# Patient Record
Sex: Female | Born: 1945 | Hispanic: Yes | Smoking: Never smoker
Health system: Southern US, Community
[De-identification: ages and names within clinical notes are randomized; demographics above are authoritative.]

## PROBLEM LIST (undated history)

## (undated) DIAGNOSIS — I10 Essential (primary) hypertension: Secondary | ICD-10-CM

## (undated) DIAGNOSIS — K219 Gastro-esophageal reflux disease without esophagitis: Secondary | ICD-10-CM

## (undated) DIAGNOSIS — E119 Type 2 diabetes mellitus without complications: Secondary | ICD-10-CM

## (undated) HISTORY — DX: Gastro-esophageal reflux disease without esophagitis: K21.9

## (undated) HISTORY — PX: CHOLECYSTECTOMY: SHX55

## (undated) HISTORY — PX: ABDOMINAL HYSTERECTOMY: SHX81

---

## 2013-10-18 ENCOUNTER — Emergency Department: Payer: Self-pay | Admitting: Emergency Medicine

## 2013-10-18 LAB — CBC
HGB: 13.8 g/dL (ref 12.0–16.0)
MCH: 29.5 pg (ref 26.0–34.0)
MCV: 86 fL (ref 80–100)
RBC: 4.69 10*6/uL (ref 3.80–5.20)
RDW: 13.2 % (ref 11.5–14.5)
WBC: 10.7 10*3/uL (ref 3.6–11.0)

## 2013-10-18 LAB — BASIC METABOLIC PANEL
BUN: 18 mg/dL (ref 7–18)
Calcium, Total: 9.8 mg/dL (ref 8.5–10.1)
Chloride: 100 mmol/L (ref 98–107)
Co2: 30 mmol/L (ref 21–32)
Potassium: 3 mmol/L — ABNORMAL LOW (ref 3.5–5.1)

## 2013-10-18 LAB — TROPONIN I: Troponin-I: <0.02 ng/mL

## 2015-06-25 ENCOUNTER — Ambulatory Visit: Payer: Self-pay

## 2015-06-25 LAB — HEPATIC FUNCTION PANEL
ALT: 11 U/L (ref 7–35)
AST: 15 U/L (ref 13–35)
Alkaline Phosphatase: 128 U/L — AB (ref 25–125)
BILIRUBIN, TOTAL: 0.6 mg/dL

## 2015-06-25 LAB — TSH: TSH: 1.31 u[IU]/mL (ref 0.41–5.90)

## 2015-06-25 LAB — BASIC METABOLIC PANEL
BUN: 12 mg/dL (ref 4–21)
Creatinine: 0.9 mg/dL (ref 0.5–1.1)
GLUCOSE: 355 mg/dL
POTASSIUM: 4 mmol/L (ref 3.4–5.3)
SODIUM: 135 mmol/L — AB (ref 137–147)

## 2015-06-25 LAB — CBC AND DIFFERENTIAL
HCT: 42 % (ref 36–46)
Hemoglobin: 13.8 g/dL (ref 12.0–16.0)
Neutrophils Absolute: 4 /uL
Platelets: 177 10*3/uL (ref 150–399)
WBC: 5.8 10^3/mL

## 2015-06-25 LAB — HEMOGLOBIN A1C: Hemoglobin A1C: 13.9

## 2015-06-25 LAB — MICROALBUMIN, URINE: Microalb, Ur: 45.3

## 2015-06-26 ENCOUNTER — Ambulatory Visit: Payer: Self-pay

## 2015-07-02 ENCOUNTER — Encounter (INDEPENDENT_AMBULATORY_CARE_PROVIDER_SITE_OTHER): Payer: Self-pay

## 2015-07-14 ENCOUNTER — Ambulatory Visit: Payer: Self-pay

## 2015-07-22 ENCOUNTER — Ambulatory Visit: Payer: Self-pay | Admitting: Specialist

## 2015-07-28 ENCOUNTER — Ambulatory Visit: Payer: Self-pay

## 2015-07-28 DIAGNOSIS — M25519 Pain in unspecified shoulder: Secondary | ICD-10-CM | POA: Insufficient documentation

## 2015-08-12 ENCOUNTER — Institutional Professional Consult (permissible substitution): Payer: Self-pay | Admitting: Specialist

## 2015-09-07 ENCOUNTER — Emergency Department
Admission: EM | Admit: 2015-09-07 | Discharge: 2015-09-08 | Disposition: A | Discharge status: Home / Self Care | Payer: Self-pay | Attending: Emergency Medicine | Admitting: Emergency Medicine

## 2015-09-07 ENCOUNTER — Encounter: Payer: Self-pay | Admitting: *Deleted

## 2015-09-07 ENCOUNTER — Emergency Department: Payer: Self-pay

## 2015-09-07 DIAGNOSIS — M19011 Primary osteoarthritis, right shoulder: Secondary | ICD-10-CM | POA: Insufficient documentation

## 2015-09-07 DIAGNOSIS — I10 Essential (primary) hypertension: Secondary | ICD-10-CM | POA: Insufficient documentation

## 2015-09-07 DIAGNOSIS — M25519 Pain in unspecified shoulder: Secondary | ICD-10-CM

## 2015-09-07 DIAGNOSIS — G8929 Other chronic pain: Secondary | ICD-10-CM | POA: Insufficient documentation

## 2015-09-07 DIAGNOSIS — M19012 Primary osteoarthritis, left shoulder: Secondary | ICD-10-CM | POA: Insufficient documentation

## 2015-09-07 DIAGNOSIS — E119 Type 2 diabetes mellitus without complications: Secondary | ICD-10-CM | POA: Insufficient documentation

## 2015-09-07 HISTORY — DX: Type 2 diabetes mellitus without complications: E11.9

## 2015-09-07 HISTORY — DX: Essential (primary) hypertension: I10

## 2015-09-07 MED ORDER — KETOROLAC TROMETHAMINE 60 MG/2ML IM SOLN
60.0000 mg | Freq: Once | INTRAMUSCULAR | Status: AC
  Administered 2015-09-07: 60 mg via INTRAMUSCULAR
  Filled 2015-09-07: qty 2

## 2015-09-07 MED ORDER — KETOROLAC TROMETHAMINE 10 MG PO TABS: 10.0000 mg | ORAL_TABLET | Freq: Three times a day (TID) | ORAL | Status: DC

## 2015-09-07 MED ORDER — HYDROCODONE-ACETAMINOPHEN 5-325 MG PO TABS
ORAL_TABLET | ORAL | Status: AC
  Administered 2015-09-07: 1 via ORAL
  Filled 2015-09-07: qty 1

## 2015-09-07 MED ORDER — TRAMADOL HCL 50 MG PO TABS: 50.0000 mg | ORAL_TABLET | Freq: Four times a day (QID) | ORAL | Status: DC | PRN

## 2015-09-07 MED ORDER — ORPHENADRINE CITRATE 30 MG/ML IJ SOLN
60.0000 mg | INTRAMUSCULAR | Status: AC
  Administered 2015-09-07: 60 mg via INTRAMUSCULAR
  Filled 2015-09-07: qty 2

## 2015-09-07 MED ORDER — CYCLOBENZAPRINE HCL 5 MG PO TABS: 5.0000 mg | ORAL_TABLET | Freq: Three times a day (TID) | ORAL | Status: DC | PRN

## 2015-09-07 MED ORDER — HYDROCODONE-ACETAMINOPHEN 5-325 MG PO TABS
1.0000 | ORAL_TABLET | Freq: Once | ORAL | Status: AC
  Administered 2015-09-07: 1 via ORAL

## 2015-09-07 NOTE — ED Provider Notes (Signed)
[facility] Emergency Department Provider Note ____________________________________________  Time seen: 2140  I have reviewed the triage vital signs and the nursing notes.  HISTORY  Chief Complaint  Shoulder Pain  HPI Brianna Church is a 69 y.o. female reports to the ED for evaluation and management of some ongoing, chronic right greater than left shoulder pain. She denies any interim injury, or trauma, but notes increasing pain to the shoulders particularly when she tries to rest at night. She does also note some difficulty with range of motion in the shoulders which causes increased pain. She describes the pain as pulsating, and notes that refers up into the back and neck at times. She denies any grip changes or distal paresthesias. She has been [facility] for this complaint, and is currently taking ibuprofen at a prescription strength dose. She denies any significant relief to her symptoms with the ibuprofen. She rates her pain at a 9/10 in triage.  Past Medical History  Diagnosis Date  . Hypertension   . Diabetes mellitus without complication (HCC)     There are no active problems to display for this patient.   Past Surgical History  Procedure Laterality Date  . Cholecystectomy    . Abdominal hysterectomy      Current Outpatient Rx  Name  Route  Sig  Dispense  Refill  . cyclobenzaprine (FLEXERIL) 5 MG tablet   Oral   Take 1 tablet (5 mg total) by mouth every 8 (eight) hours as needed for muscle spasms.   21 tablet   0   . ketorolac (TORADOL) 10 MG tablet   Oral   Take 1 tablet (10 mg total) by mouth every 8 (eight) hours.   15 tablet   0   . traMADol (ULTRAM) 50 MG tablet   Oral   Take 1 tablet (50 mg total) by mouth every 6 (six) hours as needed for moderate pain.   20 tablet   0    Allergies Review of patient's allergies indicates no known allergies.  No family history on file.  Social  History Social History  Substance Use Topics  . Smoking status: Never Smoker   . Smokeless tobacco: None  . Alcohol Use: No    Review of Systems  Constitutional: Negative for fever. Eyes: Negative for visual changes. ENT: Negative for sore throat. Cardiovascular: Negative for chest pain. Respiratory: Negative for shortness of breath. Gastrointestinal: Negative for abdominal pain, vomiting and diarrhea. Genitourinary: Negative for dysuria. Musculoskeletal: Negative for back pain. Bilateral shoulder pain as above. Skin: Negative for rash. Neurological: Negative for headaches, focal weakness or numbness. ____________________________________________  PHYSICAL EXAM:  VITAL SIGNS: ED Triage Vitals  Enc Vitals Group     BP 09/07/15 2018 220/97 mmHg     Pulse Rate 09/07/15 2018 94     Resp 09/07/15 2018 18     Temp 09/07/15 2018 98.6 F (37 C)     Temp src --      SpO2 09/07/15 2018 97 %     Weight --      Height --      Head Cir --      Peak Flow --      Pain Score 09/07/15 2020 9     Pain Loc --      Pain Edu? --      Excl. in GC? --    Constitutional: Alert and oriented. Well appearing and in no distress. Head: Normocephalic and  atraumatic.      Eyes: Conjunctivae are normal. PERRL. Normal extraocular movements      Ears: Canals clear. TMs intact bilaterally.   Nose: No congestion/rhinorrhea.   Mouth/Throat: Mucous membranes are moist.   Neck: Supple. No thyromegaly. Hematological/Lymphatic/Immunological: No cervical lymphadenopathy. Cardiovascular: Normal rate, regular rhythm.  Respiratory: Normal respiratory effort. No wheezes/rales/rhonchi. Gastrointestinal: Soft and nontender. No distention. Musculoskeletal: The shoulders are without any obvious deformity, sulcus sign, or dislocation. Patient is tender to palpation over the anterior lateral aspect of the humerus and deltoid muscles. She is noted to have decreased extension and abduction range to the  shoulders. She is also noted to have some impingement with external rotation. Rotator cuff appears to be solid with strength testing bilaterally. Nontender with normal range of motion in all other extremities.  Neurologic:  Cranial nerves II through XII grossly intact. Normal grip strength bilaterally. Normal UE DTRs bilaterally. Normal gait without ataxia. Normal speech and language. No gross focal neurologic deficits are appreciated. Skin:  Skin is warm, dry and intact. No rash noted. Psychiatric: Mood and affect are normal. Patient exhibits appropriate insight and judgment. ____________________________________________   RADIOLOGY Right Shoulder FINDINGS: No acute displaced fracture, subluxation or dislocation. Mild degenerative changes of osteoarthritis at the acromioclavicular joint. Degenerative spurring overlying the greater tubercle.  I, Kaycee Mcgaugh, Charlesetta Ivory, personally viewed and evaluated these images (plain radiographs) as part of my medical decision making.  ____________________________________________  PROCEDURES  Toradol 60 mg IM Norflex 60 mg IM Norco 5-325 mg PO ____________________________________________  INITIAL IMPRESSION / ASSESSMENT AND PLAN / ED COURSE  Patient with chronic shoulder pain secondary to underlying degenerative osteoarthritis. She also has poorly controlled hypertension noted with an elevated blood pressure at discharge. She is encouraged to dose the prescribed Toradol, Flexeril, and Ultram as directed. She will discontinue the use of her previously prescribed ibuprofen, and instead use Tylenol as needed for additional nondrowsy pain relief. She is encouraged to follow with the primary care provider in December as scheduled. ____________________________________________  FINAL CLINICAL IMPRESSION(S) / ED DIAGNOSES  Final diagnoses:  Chronic shoulder pain, unspecified laterality  Primary osteoarthritis of both shoulders      Lissa Hoard, PA-C 09/08/15 0037  Jene Every, MD 09/09/15 1212

## 2015-09-07 NOTE — ED Notes (Addendum)
Pt reports she takes blood pressure as prescribed but does not remember name of blood pressure medicine. PT reports took bp medicine today. Jenise, PA notified.

## 2015-09-07 NOTE — ED Notes (Signed)
Pain in bilateral shoulders radiating into back x 1 year.  Pt seeing PCP and taking Motrin for pain but states it is worsening.

## 2015-09-07 NOTE — Discharge Instructions (Signed)
Osteoartritis (Osteoarthritis) La osteoartritis es una enfermedad que provoca dolor e inflamacin en las articulaciones. Ocurre cuando el cartlago de la articulacin afectada se desgasta. El cartlago acta como una almohadilla que cubre los extremos de los huesos que forman una articulacin. La osteoartritis es la ms frecuente de reumatismo articular. Afecta a menudo a los ancianos. Las articulaciones que se ven ms afectadas por esta afeccin son las que se encuentran en las siguientes zonas:  Los extremos de los dedos.  Los pulgares.  El cuello.  La parte inferior de la espalda.  Las rodillas.  Las caderas CAUSAS  Con el paso del [place], el cartlago que recubre los extremos de los huesos comienza a Acupuncturist. Esto provoca friccin AGCO Corporation, lo que causa dolor y entumecimiento en las articulaciones afectadas.  FACTORES DE RIESGO Ciertos factores pueden aumentar las probabilidades de padecer osteoartritis, incluidos los siguientes:  Edad avanzada.  Exceso de Runner, broadcasting/film/video.  Uso excesivo de la articulacin.  Lesin previa en la articulacin. SIGNOS Y SNTOMAS   Dolor, hinchazn y entumecimiento en la articulacin.  Con el tiempo, la articulacin pierde su forma normal.  Pueden formarse pequeos depsitos de hueso (ostefitos) en los extremos de Nurse, learning disability.  Algunos trozos de Dow Chemical o cartlago pueden separarse y flotar dentro del espacio de la articulacin. Esto puede causar ms dolor y lesiones. DIAGNSTICO  El mdico le preguntar acerca de sus sntomas y le har un examen fsico. Le indicarn varios estudios, como:  Radiografas de Statistician.  Anlisis de sangre para descartar otros tipos de artritis. Pueden usarse pruebas adicionales para diagnosticar la enfermedad. TRATAMIENTO  Los PepsiCo del tratamiento son Human resources officer y mejorar el funcionamiento de Nurse, learning disability. Los planes de tratamiento pueden incluir lo siguiente:  Un  programa de ejercicios recomendado que permita el descanso y el alivio de la articulacin.  Un plan de control del peso.  Tcnicas de Occidental Petroleum, como las siguientes:  Aplicacin correcta de fro y Airline pilot.  Impulsos elctricos enviados a las terminaciones nerviosas que se encuentran debajo de la piel (neuroestimulacin elctrica transcutnea [TENS]).  Masajes.  Ciertos suplementos nutricionales.  Medicamentos para Human resources officer como:  Paracetamol.  Antiinflamatorios no esteroides (AINE), como el naproxeno.  Narcticos o agentes de accin central, como el tramadol.  Corticoides. Estos se pueden administrar por va oral o mediante una inyeccin.  Ciruga para reposicionar los TransMontaigne y Engineer, materials (osteotoma) o para retirar las piezas sueltas de hueso y TEFL teacher. Puede ser necesario el reemplazo de las articulaciones en estadios avanzados de la enfermedad. INSTRUCCIONES PARA EL CUIDADO EN EL HOGAR   Tome los medicamentos solamente como se lo haya indicado el mdico.  Mantenga un peso saludable. Siga las instrucciones del mdico con respecto al control del [place]. Esto puede incluir instrucciones Software engineer.  Practique los ejercicios que le indiquen. Es posible que el mdico le recomiende tipos especficos de ejercicios. Estos pueden incluir los siguientes:  Ejercicios de fortalecimiento Se realizan para fortalecer los msculos que sostienen las articulaciones afectadas por la artritis. Pueden realizarse con peso o con bandas para agregar resistencia.  Actividades Rhona Raider. Son Programmer, applications a paso ligero, gimnasia [place] de bajo impacto, que acelere el corazn.  Actividades de amplitud de movimientos. Dan agilidad a las articulaciones.  Ejercicios de equilibrio y [place]. Ayudan a McKesson se necesitan para la vida diaria.  Haga descansar a las articulaciones segn las indicaciones del mdico.  Concurra a todas las  visitas de  control como se lo haya indicado el mdico. SOLICITE ATENCIN MDICA SI:   La piel se pone roja.  Aparece una erupcin adems del dolor en la articulacin.  El dolor en la articulacin empeora.  Tiene fiebre y siente dolor en la articulacin o el msculo. SOLICITE ATENCIN MDICA DE INMEDIATO SI:  Nota una prdida importante de peso o del apetito.  Tiene transpiracin nocturna. PARA Parthenia Ames MS INFORMACIN   The [employer]ger de Artritis y Event organiser Musculoesquelticas y Dermatolgicas [facility] of Arthritis and Musculoskeletal and Skin Diseases): www.niams.http://www.myers.net/.  Instituto Lockheed Martin el Envejecimiento ([employer] on Aging): https://walker.com/.  Instituto Norteamericano de Advice worker of Rheumatology): www.rheumatology.org.   Esta informacin no tiene como fin reemplazar el consejo del mdico. Asegrese de hacerle al mdico cualquier pregunta que tenga.   Document Released: 08/10/2005 Document Revised: 11/21/2014 Elsevier Interactive Patient Education 2016 Arvin[employer]itor.  Dolor en el hombro (Shoulder Pain) El hombro es la articulacin que une los brazos al cuerpo. Los TransMontaigne que forman la articulacin del hombro son el hueso del brazo (hmero), el omplato (escpula) y Curator. La parte superior del hmero es similar a una bola y Chartered certified accountant en una cavidad ms bien plana en la escpula (cavidad glenoidea). Una combinacin de msculos y tejidos fuertes y fibrosos que [employer] msculos a los huesos (tendones) soportan la articulacin del hombro y Engineer, materials la bola en la cavidad. En diferentes zonas de la articulacin hay pequeas bolsas llenas de lquido (bursa). Actan como amortiguadores AGCO Corporation y los tejidos blandos que recubren y [place] a reducir la friccin [employer]ger tendones y el hueso al mover el brazo. La articulacin del hombro permite una amplia gama de movimientos del brazo. Este rango de movimientos permite hacer  diferentes cosas,como rascarse la espalda o lanzar una pelota. Sin embargo, esta amplitud de movimientos del hombro tambin lo hace ms propenso al dolor por uso excesivo y por lesiones. Las causas de dolor en el hombro pueden originarse tanto en las lesiones como en el uso excesivo y se pueden agrupar en las siguientes cuatro categoras:  Enrojecimiento, hinchazn y dolor (inflamacin) del tendn (tendinitis) o la bursa (bursitis).  Inestabilidad, como en la luxacin de la articulacin.  Inflamacin de la articulacin (artritis).  Un hueso roto Customer service manager). INSTRUCCIONES PARA EL CUIDADO EN EL HOGAR   Aplique hielo sobre la zona dolorida.  Ponga el hielo en una bolsa plstica.  Colquese una toalla entre la piel y la bolsa de hielo.  Deje el hielo durante 15 a , 3 a 4veces por [employer] [address], o segn las indicaciones del mdico.  Deje de usar compresas fras si no Tourist information centre manager.  Si tiene un cabestrillo o inmovilizador de hombro, llvelo del modo en que su mdico le indique. Solo debe quitrselo para ducharse o baarse. Mueva el brazo lo menos posible, pero mantenga la mano en movimiento para evitar la hinchazn.  Apriete una pelota blanda o una almohadilla de goma todo lo posible para evitar la hinchazn.  Utilice los medicamentos de venta libre o recetados para Primary school teacher, Environmental health practitioner o la fiebre, segn se lo indique el mdico. SOLICITE ATENCIN MDICA SI:   El dolor en el hombro aumenta o siente un dolor nuevo en el brazo, la mano o los dedos.  La mano o los dedos estn fros y adormecidos.  El dolor no se alivia con los United Parcel. SOLICITE ATENCIN MDICA DE INMEDIATO SI:   El  brazo, la mano o los dedos estn adormecidos o siente hormigueos.  El brazo, la mano o los dedos estn muy hinchados o se ven blancos o azules. ASEGRESE DE QUE:   Comprende estas instrucciones.  Controlar su afeccin.  Recibir ayuda de inmediato si no  mejora o si empeora.   Esta informacin no tiene Theme park managercomo fin reemplazar el consejo del mdico. Asegrese de hacerle al mdico cualquier pregunta que tenga.   Document Released: 08/10/2005 Document Revised: 11/21/2014 Elsevier Interactive Patient Education Yahoo! Inc2016 Elsevier Inc.  Take the prescription medicines as needed for pain and muscle spasms.Apply ice to reduce pain. Follow-up with [facility] for continued symptoms.

## 2015-09-07 NOTE — ED Notes (Signed)
Pt has been having pain in both shoulders  (right one hurts the most) and the pain hurts in her upper back at night for about a year, the pain has been bothering her more than usual lately. Pt has been seen at the [facility] for the pain about a month ago for the same. Has been taking ibuprofen, but the pain is not going away.

## 2015-09-08 NOTE — ED Notes (Signed)
Spoke with PlainedgeJenise, PA about pt blood pressure 239/104. States okay for pt to be discharged.

## 2015-09-08 NOTE — ED Notes (Signed)

## 2015-10-27 ENCOUNTER — Ambulatory Visit: Payer: Self-pay

## 2015-10-27 DIAGNOSIS — I152 Hypertension secondary to endocrine disorders: Secondary | ICD-10-CM | POA: Insufficient documentation

## 2015-10-27 DIAGNOSIS — E119 Type 2 diabetes mellitus without complications: Secondary | ICD-10-CM | POA: Insufficient documentation

## 2015-10-27 DIAGNOSIS — E1159 Type 2 diabetes mellitus with other circulatory complications: Secondary | ICD-10-CM | POA: Insufficient documentation

## 2015-10-27 DIAGNOSIS — I10 Essential (primary) hypertension: Secondary | ICD-10-CM

## 2015-10-27 DIAGNOSIS — K219 Gastro-esophageal reflux disease without esophagitis: Secondary | ICD-10-CM | POA: Insufficient documentation

## 2015-11-24 ENCOUNTER — Ambulatory Visit: Payer: Self-pay

## 2016-05-24 DIAGNOSIS — E119 Type 2 diabetes mellitus without complications: Secondary | ICD-10-CM

## 2016-05-24 DIAGNOSIS — K219 Gastro-esophageal reflux disease without esophagitis: Secondary | ICD-10-CM

## 2016-05-24 DIAGNOSIS — I10 Essential (primary) hypertension: Secondary | ICD-10-CM

## 2016-05-24 DIAGNOSIS — M25511 Pain in right shoulder: Secondary | ICD-10-CM

## 2017-03-14 ENCOUNTER — Other Ambulatory Visit: Payer: Self-pay

## 2017-03-14 DIAGNOSIS — E1159 Type 2 diabetes mellitus with other circulatory complications: Secondary | ICD-10-CM

## 2017-03-14 DIAGNOSIS — I10 Essential (primary) hypertension: Principal | ICD-10-CM

## 2017-03-14 MED ORDER — GLIPIZIDE 10 MG PO TABS: 10.0000 mg | ORAL_TABLET | Freq: Every day | ORAL | 1 refills | Status: DC

## 2017-03-14 MED ORDER — OMEPRAZOLE 20 MG PO CPDR: 20.0000 mg | DELAYED_RELEASE_CAPSULE | Freq: Every day | ORAL | 1 refills | Status: DC

## 2017-03-14 MED ORDER — AMLODIPINE BESYLATE 10 MG PO TABS: 10.0000 mg | ORAL_TABLET | Freq: Every day | ORAL | 1 refills | Status: DC

## 2017-03-20 IMAGING — CR DG SHOULDER 2+V*R*
1 series · 4 of 4 positions shown · non-contrast
Comparison: Right shoulder radiograph 10/18/2013.

CLINICAL DATA: 69-year-old female with pain in both shoulders for 1
year (right greater than left).

EXAM:
RIGHT SHOULDER - 2+ VIEW

[Series 1: dg shoulder right · 0.14mm/px · 4 of 4 slices shown]
[im 1/4]
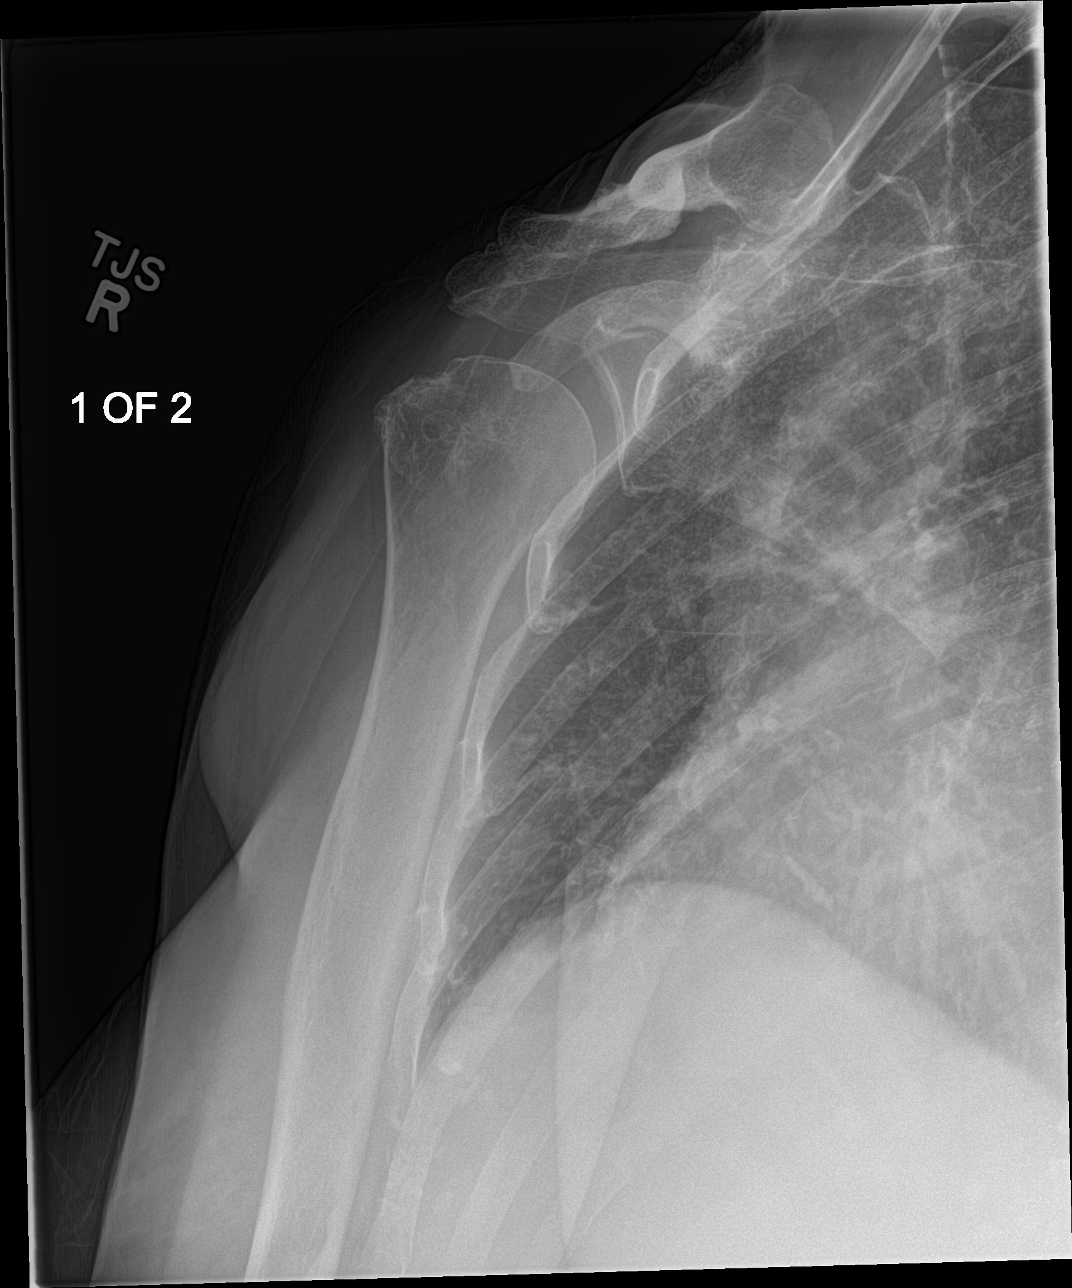
[im 2/4]
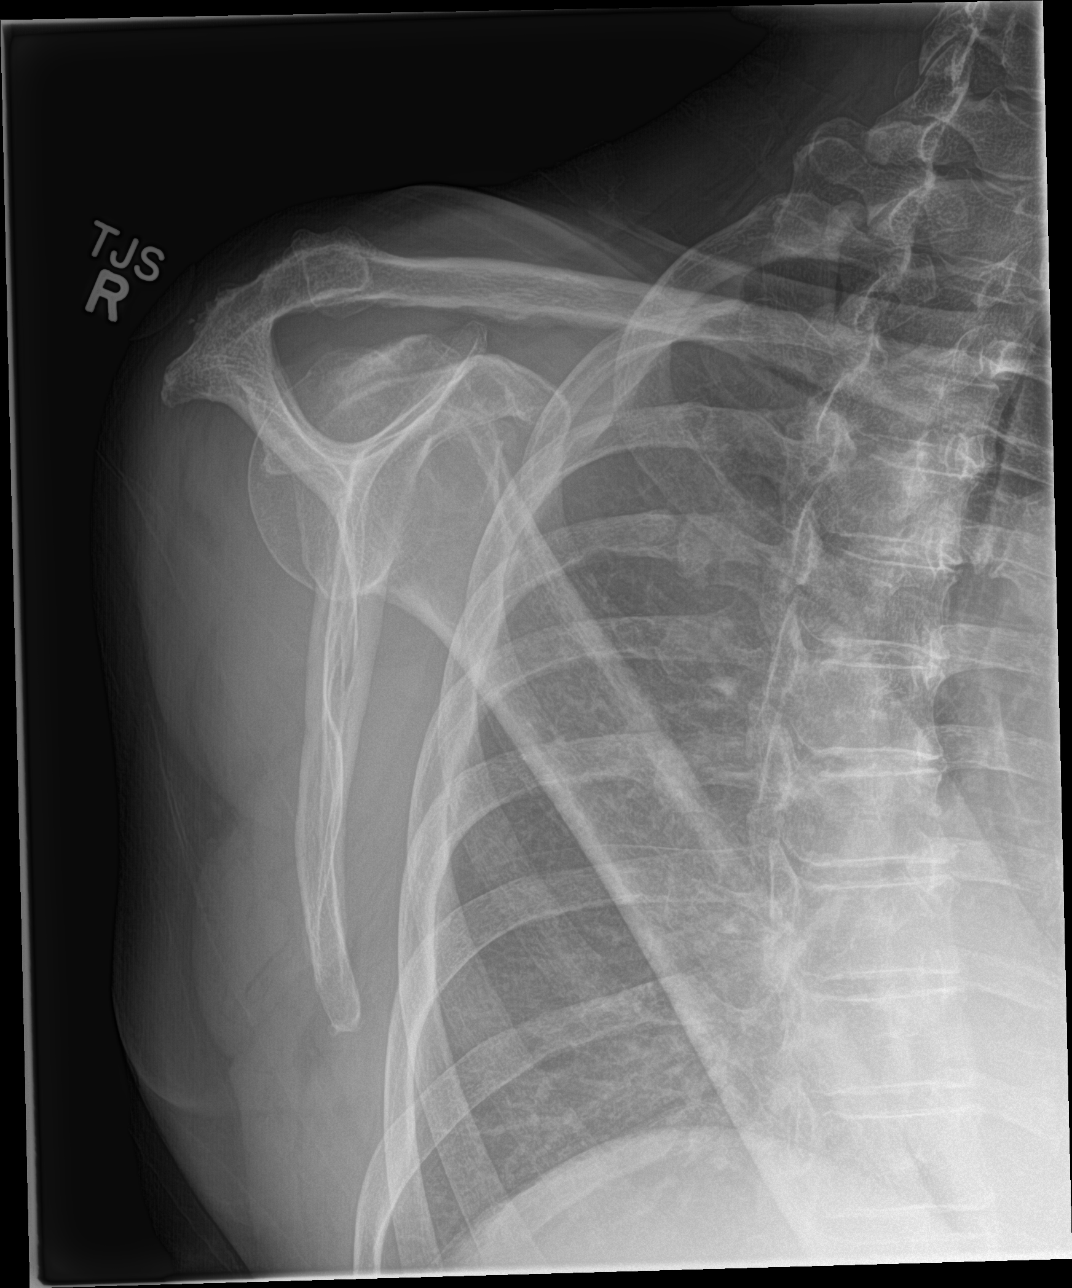
[im 3/4]
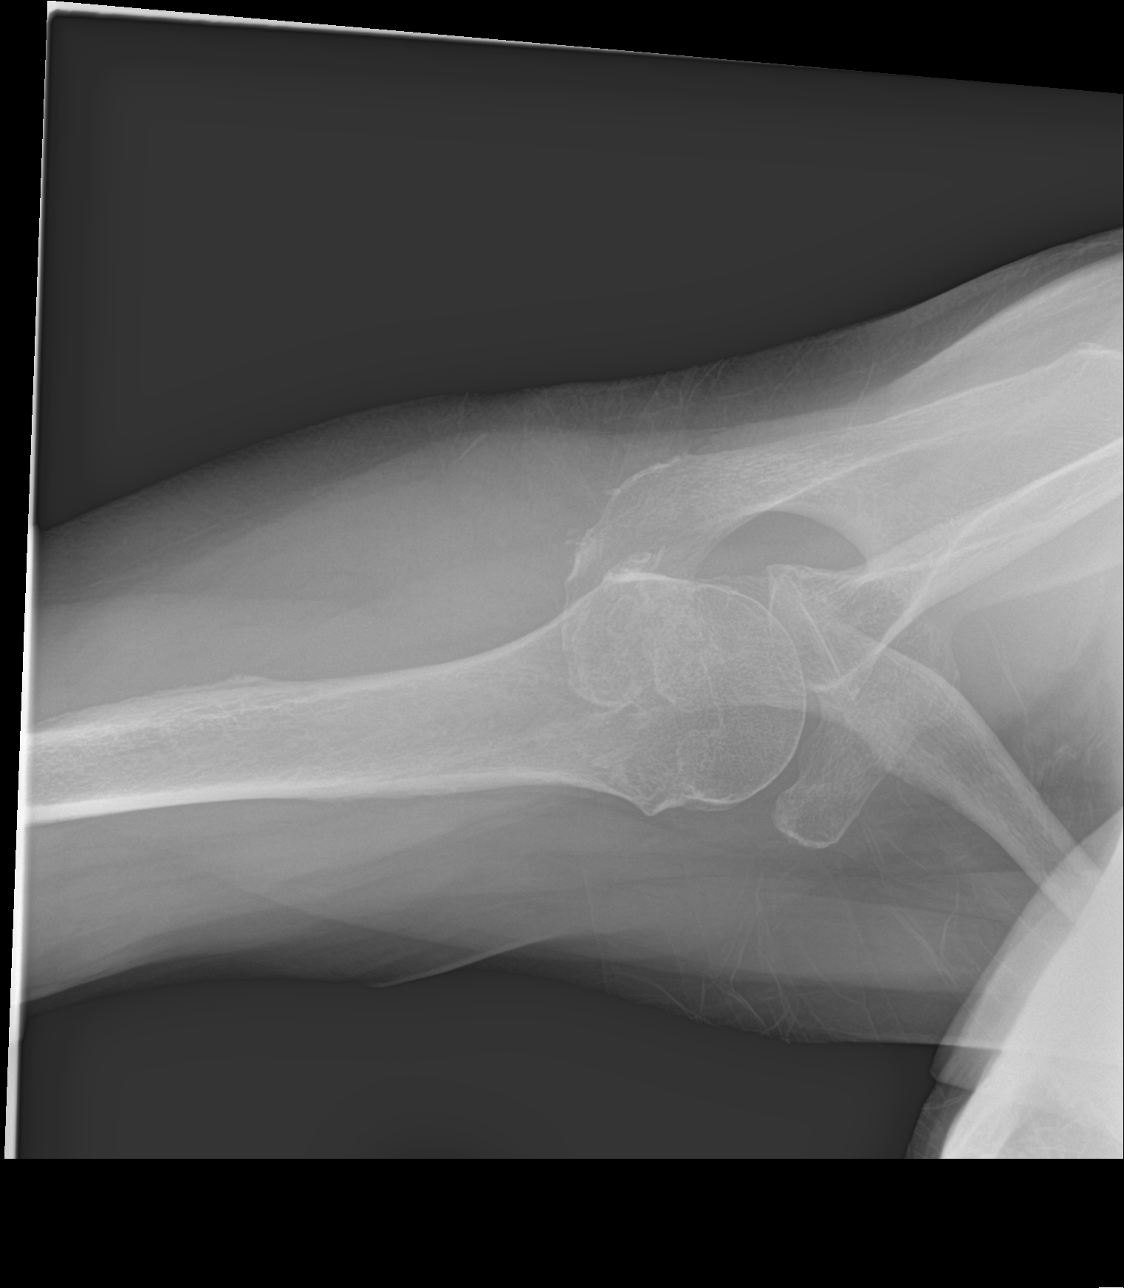
[im 4/4]
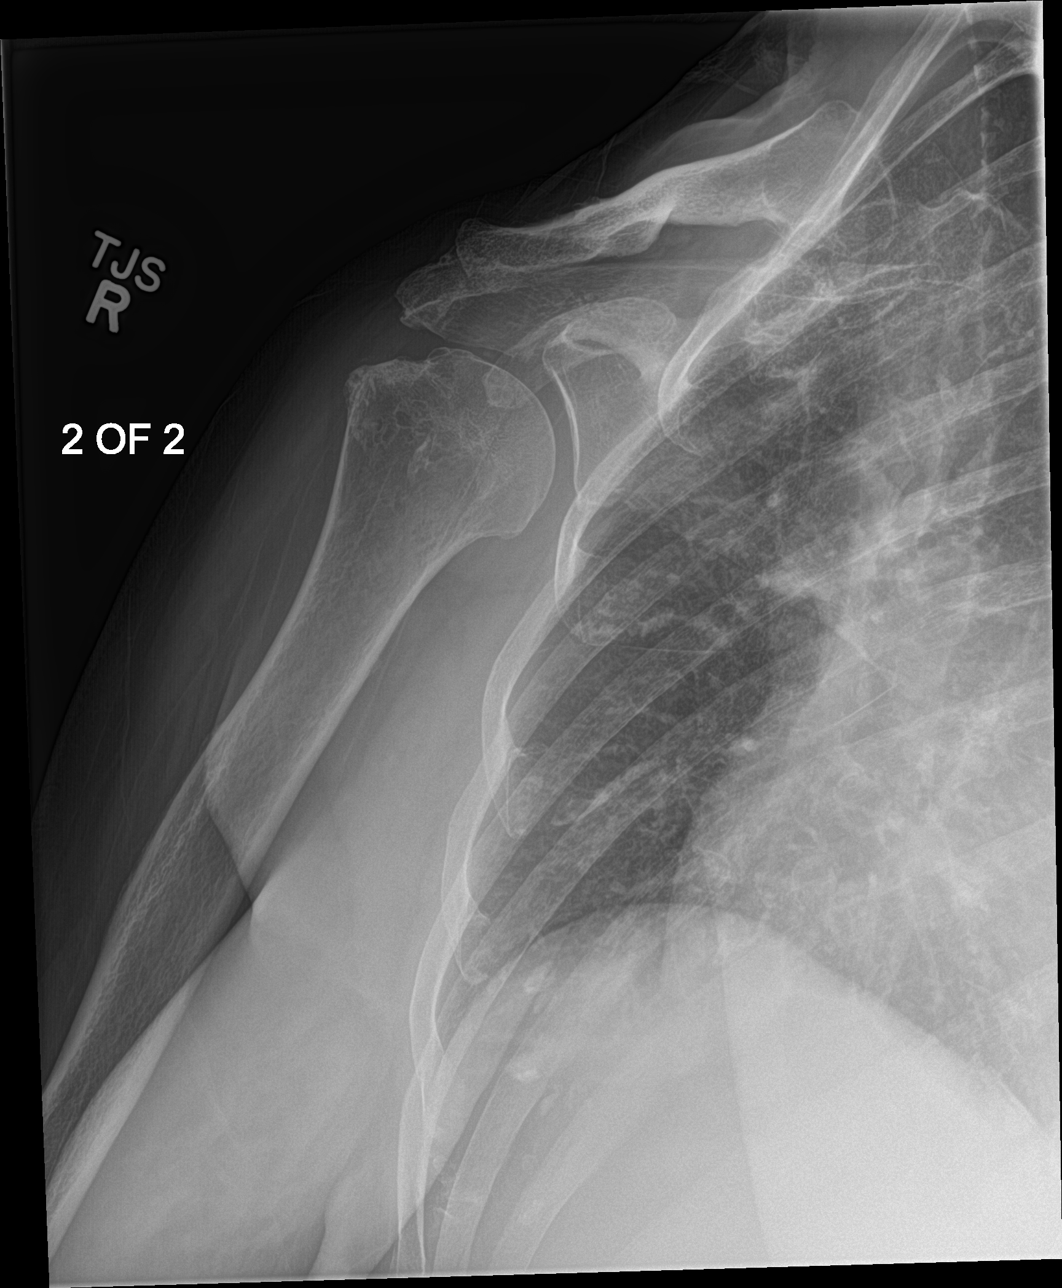

[4 of 4 positions shown; findings below may reference images not displayed]

FINDINGS: No acute displaced fracture, subluxation or dislocation. Mild
degenerative changes of osteoarthritis at the acromioclavicular
joint. Degenerative spurring overlying the greater tubercle.
IMPRESSION: 1. No acute radiographic abnormality of the right shoulder.

## 2017-03-21 ENCOUNTER — Ambulatory Visit: Payer: Self-pay | Admitting: Adult Health Nurse Practitioner

## 2017-03-21 VITALS — BP 163/92 | HR 85 | Temp 98.1°F | Wt 133.9 lb

## 2017-03-21 DIAGNOSIS — I10 Essential (primary) hypertension: Secondary | ICD-10-CM

## 2017-03-21 DIAGNOSIS — E119 Type 2 diabetes mellitus without complications: Secondary | ICD-10-CM

## 2017-03-21 LAB — GLUCOSE, POCT (MANUAL RESULT ENTRY): POC GLUCOSE: 222 mg/dL — AB (ref 70–99)

## 2017-03-21 MED ORDER — INSULIN GLARGINE 100 UNIT/ML ~~LOC~~ SOLN: 10.0000 [IU] | Freq: Every day | SUBCUTANEOUS | 11 refills | Status: DC

## 2017-03-21 NOTE — Progress Notes (Signed)
  Patient: Brianna Church Female    DOB: 03/27/1946   71 y.o.   MRN: [id number] Visit Date: 03/21/2017  Today's Provider: Jacelyn Pieah Doles-Johnson, NP   Chief Complaint  Patient presents with  . Diabetes    Needs insulin   Subjective:    HPI  HTN:  Taking medications as directed. Unknown what medications she is taking- she only takes them in the am.  Denies CP, dizziness, HA.     DM:  Taking medications as directed.  Taking Lantus 10 units daily.  CBGs averaging 70-90 after insulin therapy.     No Known Allergies Previous Medications   AMLODIPINE (NORVASC) 10 MG TABLET    Take 1 tablet (10 mg total) by mouth daily.   ATENOLOL (TENORMIN) 25 MG TABLET    Take 25 mg by mouth daily.   CYCLOBENZAPRINE (FLEXERIL) 5 MG TABLET    Take 1 tablet (5 mg total) by mouth every 8 (eight) hours as needed for muscle spasms.   GLIPIZIDE (GLUCOTROL) 10 MG TABLET    Take 1 tablet (10 mg total) by mouth daily before breakfast.   HYDRALAZINE (APRESOLINE) 25 MG TABLET    Take 25 mg by mouth 3 (three) times daily.   KETOROLAC (TORADOL) 10 MG TABLET    Take 1 tablet (10 mg total) by mouth every 8 (eight) hours.   LOSARTAN (COZAAR) 100 MG TABLET    Take 100 mg by mouth daily.   METFORMIN (GLUCOPHAGE) 1000 MG TABLET    Take 1,000 mg by mouth 2 (two) times daily with a meal.   OMEPRAZOLE (PRILOSEC) 20 MG CAPSULE    Take 1 capsule (20 mg total) by mouth daily.   TRAMADOL (ULTRAM) 50 MG TABLET    Take 1 tablet (50 mg total) by mouth every 6 (six) hours as needed for moderate pain.    Review of Systems  All other systems reviewed and are negative.   Social History  Substance Use Topics  . Smoking status: Never Smoker  . Smokeless tobacco: Not on file  . Alcohol use No   Objective:   BP (!) 163/92   Pulse 85   Temp 98.1 F (36.7 C)   Wt 133 lb 14.4 oz (60.7 kg)   Physical Exam  Constitutional: She appears well-developed and well-nourished.  HENT:  Head: Normocephalic and atraumatic.   Cardiovascular: Normal rate, regular rhythm and normal heart sounds.   Pulmonary/Chest: Effort normal and breath sounds normal.  Abdominal: Soft. Bowel sounds are normal.  Skin: Skin is warm and dry.  Vitals reviewed.       Assessment & Plan:         Will need to check routine labs today.  BP elevated today.  FU in 2 weeks bring all medications to know what the patient is taking.  Will adjust BP medications at that time and review labs.  Lantus refilled- continue 10 units daily.   Spanish interpreter used.    Jacelyn Pieah Doles-Johnson, NP   [facility]

## 2017-03-22 LAB — CBC
Hematocrit: 37.1 % (ref 34.0–46.6)
Hemoglobin: 12.1 g/dL (ref 11.1–15.9)
MCH: 28.5 pg (ref 26.6–33.0)
MCHC: 32.6 g/dL (ref 31.5–35.7)
MCV: 88 fL (ref 79–97)
PLATELETS: 190 10*3/uL (ref 150–379)
RBC: 4.24 x10E6/uL (ref 3.77–5.28)
RDW: 13.9 % (ref 12.3–15.4)
WBC: 4 10*3/uL (ref 3.4–10.8)

## 2017-03-22 LAB — COMPREHENSIVE METABOLIC PANEL
A/G RATIO: 1.3 (ref 1.2–2.2)
ALT: 20 IU/L (ref 0–32)
AST: 36 IU/L (ref 0–40)
Albumin: 4.3 g/dL (ref 3.6–4.8)
Alkaline Phosphatase: 130 IU/L — ABNORMAL HIGH (ref 39–117)
BUN/Creatinine Ratio: 13 (ref 12–28)
BUN: 11 mg/dL (ref 8–27)
Bilirubin Total: 0.4 mg/dL (ref 0.0–1.2)
CALCIUM: 9 mg/dL (ref 8.7–10.3)
CO2: 23 mmol/L (ref 18–29)
Chloride: 98 mmol/L (ref 96–106)
Creatinine, Ser: 0.86 mg/dL (ref 0.57–1.00)
GFR, EST AFRICAN AMERICAN: 80 mL/min/{1.73_m2} (ref >59)
GFR, EST NON AFRICAN AMERICAN: 69 mL/min/{1.73_m2} (ref >59)
Globulin, Total: 3.2 g/dL (ref 1.5–4.5)
Glucose: 242 mg/dL — ABNORMAL HIGH (ref 65–99)
POTASSIUM: 4.5 mmol/L (ref 3.5–5.2)
Sodium: 138 mmol/L (ref 134–144)
TOTAL PROTEIN: 7.5 g/dL (ref 6.0–8.5)

## 2017-03-22 LAB — LIPID PANEL
CHOL/HDL RATIO: 3.8 ratio (ref 0.0–4.4)
Cholesterol, Total: 128 mg/dL (ref 100–199)
HDL: 34 mg/dL — AB (ref >39)
LDL Calculated: 41 mg/dL (ref 0–99)
Triglycerides: 264 mg/dL — ABNORMAL HIGH (ref 0–149)
VLDL CHOLESTEROL CAL: 53 mg/dL — AB (ref 5–40)

## 2017-03-22 LAB — HEMOGLOBIN A1C
Est. average glucose Bld gHb Est-mCnc: 180 mg/dL
Hgb A1c MFr Bld: 7.9 % — ABNORMAL HIGH (ref 4.8–5.6)

## 2017-03-22 LAB — TSH: TSH: 1.64 u[IU]/mL (ref 0.450–4.500)

## 2017-03-23 ENCOUNTER — Ambulatory Visit: Payer: Self-pay | Admitting: Ophthalmology

## 2017-03-28 ENCOUNTER — Ambulatory Visit: Payer: Self-pay | Admitting: Pharmacy Technician

## 2017-03-28 DIAGNOSIS — Z79899 Other long term (current) drug therapy: Secondary | ICD-10-CM

## 2017-03-28 NOTE — Progress Notes (Signed)
Patient speaks Spanish.  Interpretation provided by [employer].  Interpreter [phone].    Met with patient completed financial assistance application for [place] due to recent hospital visit.  Patient agreed to be responsible for gathering financial information and forwarding to appropriate department in [facility].    Completed [facility] application and contract.  Patient agreed to all terms of the [facility] contract.   Patient is undocumented.  Patient is not a [place] Citizen or legal resident.  Patient prescribed Lantus by [facility].  [facility] unable to provide medication assistance for injectable insulins, because pharmaceutical companies who manufacture injectable insulins require that you either be a [place] Citizen or legal resident.  Explained to patient.  Patient upset.  Initially did not want to complete paperwork.  Patient changed her mind and did finish her eligibility appointment.  Provided patient with the option of seeing a provider at [facility].  She could purchase the injectable insulin at [facility] for $10 a vial.  Patient refused this option.  Also, made patient aware that NPH could be purchased at [facility] without a prescription for $28 a vial.  Suggested patient discuss with provider to make sure NPH would be a viable substitution for the Lantus.  Patient refused this option as well.    Patient asked me to reach out to [facility] to inquire about the NPH and to find out if [facility] would pay for the NPH.  Sent message to [facility] staff.    The [facility] will provide medication assistance from in-house formulary.  Brand-name medications will be obtained from pharmaceutical companies that do not require patients to be either a [place] Citizen or legal resident.  [facility]

## 2017-03-30 ENCOUNTER — Other Ambulatory Visit: Payer: Self-pay | Admitting: Adult Health Nurse Practitioner

## 2017-03-30 MED ORDER — INSULIN DETEMIR 100 UNIT/ML FLEXPEN: 10.0000 [IU] | PEN_INJECTOR | Freq: Every day | SUBCUTANEOUS | 11 refills | Status: DC

## 2017-04-04 ENCOUNTER — Ambulatory Visit: Payer: Self-pay

## 2017-04-11 ENCOUNTER — Ambulatory Visit: Payer: Self-pay

## 2017-04-13 ENCOUNTER — Ambulatory Visit: Payer: Self-pay | Admitting: Ophthalmology

## 2017-04-13 ENCOUNTER — Other Ambulatory Visit: Payer: Self-pay

## 2017-04-13 DIAGNOSIS — E1159 Type 2 diabetes mellitus with other circulatory complications: Secondary | ICD-10-CM

## 2017-04-13 DIAGNOSIS — I10 Essential (primary) hypertension: Principal | ICD-10-CM

## 2017-04-13 MED ORDER — METFORMIN HCL 1000 MG PO TABS: 1000.0000 mg | ORAL_TABLET | Freq: Two times a day (BID) | ORAL | 0 refills | Status: DC

## 2017-04-13 MED ORDER — CYCLOBENZAPRINE HCL 5 MG PO TABS: 5.0000 mg | ORAL_TABLET | Freq: Three times a day (TID) | ORAL | 0 refills | Status: DC | PRN

## 2017-04-13 MED ORDER — GLIPIZIDE 10 MG PO TABS: 10.0000 mg | ORAL_TABLET | Freq: Every day | ORAL | 1 refills | Status: DC

## 2017-04-13 MED ORDER — AMLODIPINE BESYLATE 10 MG PO TABS: 10.0000 mg | ORAL_TABLET | Freq: Every day | ORAL | 1 refills | Status: DC

## 2017-04-13 MED ORDER — OMEPRAZOLE 20 MG PO CPDR: 20.0000 mg | DELAYED_RELEASE_CAPSULE | Freq: Every day | ORAL | 1 refills | Status: DC

## 2017-04-25 ENCOUNTER — Ambulatory Visit: Payer: Self-pay

## 2018-01-21 ENCOUNTER — Telehealth: Payer: Self-pay | Admitting: Pharmacy Technician

## 2018-01-21 NOTE — Telephone Encounter (Signed)
Patient failed to provide 2019 financial documentation.  No additional medication assistance will be provided by [facility] without the required proof of income documentation.  Patient notified by letter.  Betty J. Kluttz Care Manager [facility] 

## 2018-02-01 ENCOUNTER — Ambulatory Visit: Payer: Self-pay | Admitting: Adult Health Nurse Practitioner

## 2018-02-01 VITALS — BP 164/87 | HR 80 | Temp 99.5°F | Wt 128.2 lb

## 2018-02-01 DIAGNOSIS — I152 Hypertension secondary to endocrine disorders: Secondary | ICD-10-CM

## 2018-02-01 DIAGNOSIS — I10 Essential (primary) hypertension: Secondary | ICD-10-CM

## 2018-02-01 DIAGNOSIS — E118 Type 2 diabetes mellitus with unspecified complications: Secondary | ICD-10-CM

## 2018-02-01 DIAGNOSIS — Z794 Long term (current) use of insulin: Secondary | ICD-10-CM

## 2018-02-01 DIAGNOSIS — E1159 Type 2 diabetes mellitus with other circulatory complications: Secondary | ICD-10-CM

## 2018-02-01 MED ORDER — INSULIN DETEMIR 100 UNIT/ML FLEXPEN: 10.0000 [IU] | PEN_INJECTOR | Freq: Every day | SUBCUTANEOUS | 11 refills | Status: DC

## 2018-02-01 MED ORDER — METFORMIN HCL 1000 MG PO TABS: 1000.0000 mg | ORAL_TABLET | Freq: Two times a day (BID) | ORAL | 3 refills | Status: DC

## 2018-02-01 MED ORDER — AMLODIPINE BESYLATE 10 MG PO TABS: 10.0000 mg | ORAL_TABLET | Freq: Every day | ORAL | 3 refills | Status: DC

## 2018-02-01 MED ORDER — OMEPRAZOLE 20 MG PO CPDR: 20.0000 mg | DELAYED_RELEASE_CAPSULE | Freq: Every day | ORAL | 1 refills | Status: DC

## 2018-02-01 MED ORDER — HYDRALAZINE HCL 25 MG PO TABS: 25.0000 mg | ORAL_TABLET | Freq: Three times a day (TID) | ORAL | 3 refills | Status: DC

## 2018-02-01 MED ORDER — GLIPIZIDE 10 MG PO TABS: 10.0000 mg | ORAL_TABLET | Freq: Every day | ORAL | 3 refills | Status: DC

## 2018-02-01 MED ORDER — LOSARTAN POTASSIUM 100 MG PO TABS: 100.0000 mg | ORAL_TABLET | Freq: Every day | ORAL | 3 refills | Status: DC

## 2018-02-01 NOTE — Progress Notes (Signed)
Subjective:    Patient ID: Brianna Church, female    DOB: 04/15/1946, 72 y.o.   MRN: [id number]  HPI  Brianna Church is a 72 yo female here for med refill. She ran out of meds yesterday. Pt is here with family members and an [employer] PA student interpreted. She presents with complaints of some choking when swallowing and some hoarseness. Pt denies fever.  Pt reports she needs glucose test strips.   Patient Active Problem List   Diagnosis Date Noted  . Diabetes (HCC) 10/27/2015  . Hypertension 10/27/2015  . GERD (gastroesophageal reflux disease) 10/27/2015  . Shoulder pain 07/28/2015   Allergies as of 02/01/2018   No Known Allergies     Medication List        Accurate as of 02/01/18  7:02 PM. Always use your most recent med list.          amLODipine 10 MG tablet Commonly known as:  NORVASC Take 1 tablet (10 mg total) by mouth daily.   cyclobenzaprine 5 MG tablet Commonly known as:  FLEXERIL Take 1 tablet (5 mg total) by mouth every 8 (eight) hours as needed for muscle spasms.   glipiZIDE 10 MG tablet Commonly known as:  GLUCOTROL Take 1 tablet (10 mg total) by mouth daily before breakfast.   hydrALAZINE 25 MG tablet Commonly known as:  APRESOLINE Take 25 mg by mouth 3 (three) times daily.   Insulin Detemir 100 UNIT/ML Pen Commonly known as:  LEVEMIR FLEXPEN Inject 10 Units into the skin daily at 10 pm.   ketorolac 10 MG tablet Commonly known as:  TORADOL Take 1 tablet (10 mg total) by mouth every 8 (eight) hours.   losartan 100 MG tablet Commonly known as:  COZAAR Take 100 mg by mouth daily.   metFORMIN 1000 MG tablet Commonly known as:  GLUCOPHAGE Take 1 tablet (1,000 mg total) by mouth 2 (two) times daily with a meal.   omeprazole 20 MG capsule Commonly known as:  PRILOSEC Take 1 capsule (20 mg total) by mouth daily.   traMADol 50 MG tablet Commonly known as:  ULTRAM Take 1 tablet (50 mg total) by mouth every 6 (six) hours as needed for moderate  pain.        Review of Systems  All other systems reviewed and are negative.      Objective:   Physical Exam  Constitutional: She is oriented to person, place, and time. She appears well-developed and well-nourished.  HENT:  Head: Normocephalic and atraumatic.  Mouth/Throat: Oropharynx is clear and moist and mucous membranes are normal.  Eyes: Pupils are equal, round, and reactive to light.  Neck: Normal range of motion. Neck supple. No thyromegaly present.  Cardiovascular: Normal rate, regular rhythm and normal heart sounds.  Pulmonary/Chest: Effort normal and breath sounds normal.  Abdominal: Soft. Bowel sounds are normal.  Lymphadenopathy:    She has no cervical adenopathy.  Neurological: She is alert and oriented to person, place, and time.  Vitals reviewed.   BP (!) 164/87 (BP Location: Left Arm, Patient Position: Sitting, Cuff Size: Normal)   Pulse 80   Temp 99.5 F (37.5 C)   Wt 128 lb 3.2 oz (58.2 kg)        Assessment & Plan:   Refilled meds.  Counseled pt to gargle w/ warm salt water for sore throat.  Labs tonight.   ?BP high secondary to no medications today.  Will check CBC due to slight fever. No other indications of infectious  process.   F/u  Tuesday for lab review with Spanish interpreter.

## 2018-02-02 LAB — COMPREHENSIVE METABOLIC PANEL
ALT: 14 IU/L (ref 0–32)
AST: 29 IU/L (ref 0–40)
Albumin/Globulin Ratio: 1.5 (ref 1.2–2.2)
Albumin: 4 g/dL (ref 3.5–4.8)
Alkaline Phosphatase: 123 IU/L — ABNORMAL HIGH (ref 39–117)
BILIRUBIN TOTAL: 0.4 mg/dL (ref 0.0–1.2)
BUN/Creatinine Ratio: 10 — ABNORMAL LOW (ref 12–28)
BUN: 10 mg/dL (ref 8–27)
CALCIUM: 8.4 mg/dL — AB (ref 8.7–10.3)
CHLORIDE: 103 mmol/L (ref 96–106)
CO2: 23 mmol/L (ref 20–29)
Creatinine, Ser: 0.99 mg/dL (ref 0.57–1.00)
GFR calc Af Amer: 67 mL/min/{1.73_m2} (ref >59)
GFR calc non Af Amer: 58 mL/min/{1.73_m2} — ABNORMAL LOW (ref >59)
GLUCOSE: 250 mg/dL — AB (ref 65–99)
Globulin, Total: 2.7 g/dL (ref 1.5–4.5)
Potassium: 5.4 mmol/L — ABNORMAL HIGH (ref 3.5–5.2)
Sodium: 139 mmol/L (ref 134–144)
Total Protein: 6.7 g/dL (ref 6.0–8.5)

## 2018-02-02 LAB — TSH: TSH: 1.95 u[IU]/mL (ref 0.450–4.500)

## 2018-02-02 LAB — HEMOGLOBIN A1C
ESTIMATED AVERAGE GLUCOSE: 171 mg/dL
Hgb A1c MFr Bld: 7.6 % — ABNORMAL HIGH (ref 4.8–5.6)

## 2018-02-02 LAB — CBC
Hematocrit: 38.7 % (ref 34.0–46.6)
Hemoglobin: 12.3 g/dL (ref 11.1–15.9)
MCH: 29.7 pg (ref 26.6–33.0)
MCHC: 31.8 g/dL (ref 31.5–35.7)
MCV: 94 fL (ref 79–97)
PLATELETS: 126 10*3/uL — AB (ref 150–379)
RBC: 4.14 x10E6/uL (ref 3.77–5.28)
RDW: 13.5 % (ref 12.3–15.4)
WBC: 3.9 10*3/uL (ref 3.4–10.8)

## 2018-02-02 LAB — LIPID PANEL
CHOLESTEROL TOTAL: 105 mg/dL (ref 100–199)
Chol/HDL Ratio: 3.4 ratio (ref 0.0–4.4)
HDL: 31 mg/dL — AB (ref >39)
LDL Calculated: 40 mg/dL (ref 0–99)
TRIGLYCERIDES: 170 mg/dL — AB (ref 0–149)
VLDL Cholesterol Cal: 34 mg/dL (ref 5–40)

## 2018-02-06 ENCOUNTER — Ambulatory Visit: Payer: Self-pay

## 2018-02-07 ENCOUNTER — Other Ambulatory Visit: Payer: Self-pay | Admitting: Urology

## 2018-02-07 MED ORDER — INSULIN NPH (HUMAN) (ISOPHANE) 100 UNIT/ML ~~LOC~~ SUSP: 10.0000 [IU] | Freq: Every day | SUBCUTANEOUS | 3 refills | Status: DC

## 2018-02-07 NOTE — Progress Notes (Signed)
Prescription in for NPH 10 units at bedtime for patient.

## 2018-02-14 ENCOUNTER — Telehealth: Payer: Self-pay | Admitting: Pharmacy Technician

## 2018-02-14 NOTE — Telephone Encounter (Signed)
Received updated proof of income.  Patient eligible to receive medication assistance at [facility] through 2019, as long as eligibility requirements continue to be met.  [facility]

## 2018-08-07 ENCOUNTER — Ambulatory Visit: Payer: Self-pay | Admitting: Family Medicine

## 2018-08-07 VITALS — BP 163/92 | HR 93 | Temp 98.2°F | Ht 61.0 in | Wt 133.0 lb

## 2018-08-07 DIAGNOSIS — G4700 Insomnia, unspecified: Secondary | ICD-10-CM

## 2018-08-07 DIAGNOSIS — I152 Hypertension secondary to endocrine disorders: Secondary | ICD-10-CM

## 2018-08-07 DIAGNOSIS — E118 Type 2 diabetes mellitus with unspecified complications: Secondary | ICD-10-CM

## 2018-08-07 DIAGNOSIS — E1159 Type 2 diabetes mellitus with other circulatory complications: Secondary | ICD-10-CM

## 2018-08-07 DIAGNOSIS — Z794 Long term (current) use of insulin: Secondary | ICD-10-CM

## 2018-08-07 DIAGNOSIS — R52 Pain, unspecified: Secondary | ICD-10-CM

## 2018-08-07 DIAGNOSIS — K219 Gastro-esophageal reflux disease without esophagitis: Secondary | ICD-10-CM

## 2018-08-07 DIAGNOSIS — Z09 Encounter for follow-up examination after completed treatment for conditions other than malignant neoplasm: Secondary | ICD-10-CM

## 2018-08-07 DIAGNOSIS — I10 Essential (primary) hypertension: Secondary | ICD-10-CM

## 2018-08-07 MED ORDER — CYCLOBENZAPRINE HCL 5 MG PO TABS: 5.0000 mg | ORAL_TABLET | Freq: Three times a day (TID) | ORAL | 2 refills | Status: DC | PRN

## 2018-08-07 MED ORDER — INSULIN DETEMIR 100 UNIT/ML FLEXPEN: 10.0000 [IU] | PEN_INJECTOR | Freq: Every day | SUBCUTANEOUS | 11 refills | Status: DC

## 2018-08-07 MED ORDER — GLIPIZIDE 10 MG PO TABS: 10.0000 mg | ORAL_TABLET | Freq: Every day | ORAL | 3 refills | Status: DC

## 2018-08-07 MED ORDER — KETOROLAC TROMETHAMINE 10 MG PO TABS: 10.0000 mg | ORAL_TABLET | Freq: Three times a day (TID) | ORAL | 2 refills | Status: DC

## 2018-08-07 MED ORDER — INSULIN NPH (HUMAN) (ISOPHANE) 100 UNIT/ML ~~LOC~~ SUSP: 10.0000 [IU] | Freq: Every day | SUBCUTANEOUS | 12 refills | Status: DC

## 2018-08-07 MED ORDER — AMLODIPINE BESYLATE 10 MG PO TABS: 10.0000 mg | ORAL_TABLET | Freq: Every day | ORAL | 3 refills | Status: DC

## 2018-08-07 MED ORDER — HYDRALAZINE HCL 25 MG PO TABS: 25.0000 mg | ORAL_TABLET | Freq: Three times a day (TID) | ORAL | 3 refills | Status: DC

## 2018-08-07 MED ORDER — TRAZODONE HCL 50 MG PO TABS: 50.0000 mg | ORAL_TABLET | Freq: Every day | ORAL | 2 refills | Status: DC

## 2018-08-07 MED ORDER — LOSARTAN POTASSIUM 100 MG PO TABS: 100.0000 mg | ORAL_TABLET | Freq: Every day | ORAL | 3 refills | Status: DC

## 2018-08-07 MED ORDER — METFORMIN HCL 1000 MG PO TABS: 1000.0000 mg | ORAL_TABLET | Freq: Two times a day (BID) | ORAL | 1 refills | Status: DC

## 2018-08-07 MED ORDER — OMEPRAZOLE 20 MG PO CPDR: 20.0000 mg | DELAYED_RELEASE_CAPSULE | Freq: Every day | ORAL | 1 refills | Status: DC

## 2018-08-07 NOTE — Progress Notes (Signed)
Follow Up  Subjective:    Patient ID: Brianna Church, female    DOB: 02/08/1946, 72 y.o.   MRN: [id number]   Chief Complaint  Patient presents with  . Medication Refill    HTN, Diabetes  . Back Pain    Entire back, L shoulder pain    HPI  Ms. Brianna Church is a [age] female with a past medical history of Hypertension, GERD, and Diabetes. She is here today for follow up.   Current Status: Since her last office visit, she states that she cannot sleep on her left side because of increased pain. She has been having pain for about 2 years. She usually to takes Acetaminophen for pain. [place] the [place], [place] [place] and lives there for 6 months. She is accompanied today by her grand daughter and her child.    She denies fevers, chills, fatigue, recent infections, weight loss, and night sweats. She has not had any headaches, visual changes, dizziness, and falls. No chest pain, heart palpitations, cough and shortness of breath reported. No reports of GI problems such as nausea, vomiting, diarrhea, and constipation. She has no reports of blood in stools, dysuria and hematuria. No depression or anxiety reported.   Review of Systems  Respiratory: Negative.   Cardiovascular: Negative.   Gastrointestinal: Negative.   Musculoskeletal: Positive for arthralgias (Generalized. ).  Neurological: Negative.   Psychiatric/Behavioral: Negative.    Objective:   Physical Exam  Constitutional: She appears well-developed and well-nourished.  Cardiovascular: Normal rate, regular rhythm, normal heart sounds and intact distal pulses.  Pulmonary/Chest: Effort normal and breath sounds normal.  Abdominal: Soft. Bowel sounds are normal.  Musculoskeletal: Normal range of motion.  Psychiatric: She has a normal mood and affect. Her behavior is normal. Judgment and thought content normal.  Nursing note and vitals reviewed.  Assessment & Plan:   1. Type 2 diabetes mellitus with  complication, with long-term current use of insulin (HCC) g on b A1c stable at 7.6 on 02/01/2018. He will continue Glucotrol, Metformin, and Insulin as prescribed. She will continue to decrease foods/beverages high in sugars and carbs and follow Heart Healthy or DASH diet. Increase physical activity to at least 30 minutes cardio exercise daily.  - glipiZIDE (GLUCOTROL) 10 MG tablet; Take 1 tablet (10 mg total) by mouth daily before breakfast.  Dispense: 90 tablet; Refill: 3 - Insulin Detemir (LEVEMIR FLEXPEN) 100 UNIT/ML Pen; Inject 10 Units into the skin daily at 10 pm.  Dispense: 15 mL; Refill: 11 - metFORMIN (GLUCOPHAGE) 1000 MG tablet; Take 1 tablet (1,000 mg total) by mouth 2 (two) times daily with a meal.  Dispense: 180 tablet; Refill: 1  2. Hypertension associated with diabetes (HCC) Blood pressure is elevated today. She will continue Amlodipine and Losartan as prescribed. She will continue to decrease high sodium intake, excessive alcohol intake, increase potassium intake, smoking cessation, and increase physical activity of at least 30 minutes of cardio activity daily. She will continue to follow Heart Healthy or DASH diet. - amLODipine (NORVASC) 10 MG tablet; Take 1 tablet (10 /mg total) by mouth daily.  Dispense: 90 tablet; Refill: 3 - hydrALAZINE (APRESOLINE) 25 MG tablet; Take 1 tablet (25 mg total) by mouth 3 (three) times daily.  Dispense: 270 tablet; Refill: 3 - losartan (COZAAR) 100 MG tablet; Take 1 tablet (100 mg total) by mouth daily.  Dispense: 30 tablet; Refill: 3  3. Gastroesophageal reflux disease, esophagitis presence not specified Stable today. She will  continue Omeprazole as prescribed today.  - omeprazole (PRILOSEC) 20 MG capsule; Take 1 capsule (20 mg total) by mouth daily.  Dispense: 90 capsule; Refill: 1  4. Pain - cyclobenzaprine (FLEXERIL) 5 MG tablet; Take 1 tablet (5 mg total) by mouth every 8 (eight) hours as needed for muscle spasms.  Dispense: 21 tablet; Refill:  2 - ketorolac (TORADOL) 10 MG tablet; Take 1 tablet (10 mg total) by mouth every 8 (eight) hours.  Dispense: 15 tablet; Refill: 2  5. Insomnia, unspecified type - traZODone (DESYREL) 50 MG tablet; Take 1 tablet (50 mg total) by mouth at bedtime.  Dispense: 90 tablet; Refill: 2  6. Follow up She will follow up in 6 months.   Meds ordered this encounter  Medications  . amLODipine (NORVASC) 10 MG tablet    Sig: Take 1 tablet (10 mg total) by mouth daily.    Dispense:  90 tablet    Refill:  3  . cyclobenzaprine (FLEXERIL) 5 MG tablet    Sig: Take 1 tablet (5 mg total) by mouth every 8 (eight) hours as needed for muscle spasms.    Dispense:  21 tablet    Refill:  2  . glipiZIDE (GLUCOTROL) 10 MG tablet    Sig: Take 1 tablet (10 mg total) by mouth daily before breakfast.    Dispense:  90 tablet    Refill:  3  . hydrALAZINE (APRESOLINE) 25 MG tablet    Sig: Take 1 tablet (25 mg total) by mouth 3 (three) times daily.    Dispense:  270 tablet    Refill:  3  . Insulin Detemir (LEVEMIR FLEXPEN) 100 UNIT/ML Pen    Sig: Inject 10 Units into the skin daily at 10 pm.    Dispense:  15 mL    Refill:  11  . insulin NPH Human (HUMULIN N) 100 UNIT/ML injection    Sig: Inject 0.1 mLs (10 Units total) into the skin at bedtime.    Dispense:  10 mL    Refill:  12  . ketorolac (TORADOL) 10 MG tablet    Sig: Take 1 tablet (10 mg total) by mouth every 8 (eight) hours.    Dispense:  15 tablet    Refill:  2  . losartan (COZAAR) 100 MG tablet    Sig: Take 1 tablet (100 mg total) by mouth daily.    Dispense:  30 tablet    Refill:  3  . metFORMIN (GLUCOPHAGE) 1000 MG tablet    Sig: Take 1 tablet (1,000 mg total) by mouth 2 (two) times daily with a meal.    Dispense:  180 tablet    Refill:  1  . omeprazole (PRILOSEC) 20 MG capsule    Sig: Take 1 capsule (20 mg total) by mouth daily.    Dispense:  90 capsule    Refill:  1  . traZODone (DESYREL) 50 MG tablet    Sig: Take 1 tablet (50 mg total) by  mouth at bedtime.    Dispense:  90 tablet    Refill:  2    Raliegh Ip,  MSN, FNP-C [facility] [address] [place], [place] [address] [phone]

## 2018-08-23 ENCOUNTER — Other Ambulatory Visit: Payer: Self-pay | Admitting: Adult Health Nurse Practitioner

## 2018-08-23 MED ORDER — PRAVASTATIN SODIUM 40 MG PO TABS: 40.0000 mg | ORAL_TABLET | Freq: Every day | ORAL | 3 refills | Status: DC

## 2018-08-30 ENCOUNTER — Other Ambulatory Visit: Payer: Self-pay | Admitting: Adult Health Nurse Practitioner

## 2018-08-30 MED ORDER — ASPIRIN EC 81 MG PO TBEC: 81.0000 mg | DELAYED_RELEASE_TABLET | Freq: Every day | ORAL | 2 refills | Status: DC

## 2019-02-05 ENCOUNTER — Ambulatory Visit: Payer: Self-pay

## 2019-06-26 ENCOUNTER — Telehealth: Payer: Self-pay | Admitting: Pharmacy Technician

## 2019-06-26 NOTE — Telephone Encounter (Signed)
Patient failed to provide2020 financial documentation. No additional medication assistance will be provided by [facility] without the required proof of income documentation. Patient notified by letter.  Bynum Mccullars, CPhT [facility] 

## 2022-07-26 ENCOUNTER — Other Ambulatory Visit: Payer: Self-pay

## 2022-07-26 ENCOUNTER — Inpatient Hospital Stay
Admission: EM | Admit: 2022-07-26 | Discharge: 2022-07-28 | DRG: 638 | Disposition: A | Discharge status: Home / Self Care | Payer: Self-pay | Attending: Internal Medicine | Admitting: Internal Medicine

## 2022-07-26 ENCOUNTER — Encounter: Payer: Self-pay | Admitting: Emergency Medicine

## 2022-07-26 DIAGNOSIS — E1165 Type 2 diabetes mellitus with hyperglycemia: Principal | ICD-10-CM | POA: Diagnosis present

## 2022-07-26 DIAGNOSIS — B9620 Unspecified Escherichia coli [E. coli] as the cause of diseases classified elsewhere: Secondary | ICD-10-CM | POA: Diagnosis present

## 2022-07-26 DIAGNOSIS — R739 Hyperglycemia, unspecified: Principal | ICD-10-CM

## 2022-07-26 DIAGNOSIS — R519 Headache, unspecified: Secondary | ICD-10-CM

## 2022-07-26 DIAGNOSIS — N1831 Chronic kidney disease, stage 3a: Secondary | ICD-10-CM | POA: Diagnosis present

## 2022-07-26 DIAGNOSIS — E871 Hypo-osmolality and hyponatremia: Secondary | ICD-10-CM | POA: Diagnosis present

## 2022-07-26 DIAGNOSIS — I152 Hypertension secondary to endocrine disorders: Secondary | ICD-10-CM | POA: Diagnosis present

## 2022-07-26 DIAGNOSIS — E1169 Type 2 diabetes mellitus with other specified complication: Secondary | ICD-10-CM | POA: Diagnosis present

## 2022-07-26 DIAGNOSIS — D696 Thrombocytopenia, unspecified: Secondary | ICD-10-CM | POA: Diagnosis present

## 2022-07-26 DIAGNOSIS — E785 Hyperlipidemia, unspecified: Secondary | ICD-10-CM | POA: Diagnosis present

## 2022-07-26 DIAGNOSIS — N390 Urinary tract infection, site not specified: Secondary | ICD-10-CM | POA: Diagnosis present

## 2022-07-26 DIAGNOSIS — K219 Gastro-esophageal reflux disease without esophagitis: Secondary | ICD-10-CM | POA: Diagnosis present

## 2022-07-26 DIAGNOSIS — H538 Other visual disturbances: Secondary | ICD-10-CM | POA: Diagnosis present

## 2022-07-26 DIAGNOSIS — R300 Dysuria: Secondary | ICD-10-CM

## 2022-07-26 DIAGNOSIS — N179 Acute kidney failure, unspecified: Secondary | ICD-10-CM | POA: Diagnosis present

## 2022-07-26 DIAGNOSIS — Z20822 Contact with and (suspected) exposure to covid-19: Secondary | ICD-10-CM | POA: Diagnosis present

## 2022-07-26 DIAGNOSIS — R350 Frequency of micturition: Secondary | ICD-10-CM

## 2022-07-26 DIAGNOSIS — Z7984 Long term (current) use of oral hypoglycemic drugs: Secondary | ICD-10-CM

## 2022-07-26 DIAGNOSIS — E1159 Type 2 diabetes mellitus with other circulatory complications: Secondary | ICD-10-CM | POA: Diagnosis present

## 2022-07-26 DIAGNOSIS — Z794 Long term (current) use of insulin: Secondary | ICD-10-CM

## 2022-07-26 DIAGNOSIS — E1122 Type 2 diabetes mellitus with diabetic chronic kidney disease: Secondary | ICD-10-CM | POA: Diagnosis present

## 2022-07-26 DIAGNOSIS — J069 Acute upper respiratory infection, unspecified: Secondary | ICD-10-CM | POA: Diagnosis present

## 2022-07-26 DIAGNOSIS — Z91148 Patient's other noncompliance with medication regimen for other reason: Secondary | ICD-10-CM

## 2022-07-26 LAB — URINALYSIS, ROUTINE W REFLEX MICROSCOPIC
Bilirubin Urine: NEGATIVE
Glucose, UA: >=500 mg/dL — AB
Ketones, ur: 5 mg/dL — AB
Nitrite: NEGATIVE
Protein, ur: 100 mg/dL — AB
Specific Gravity, Urine: 1.02 (ref 1.005–1.030)
WBC, UA: >50 WBC/hpf — ABNORMAL HIGH (ref 0–5)
pH: 6 (ref 5.0–8.0)

## 2022-07-26 LAB — COMPREHENSIVE METABOLIC PANEL
ALT: 21 U/L (ref 0–44)
AST: 31 U/L (ref 15–41)
Albumin: 4 g/dL (ref 3.5–5.0)
Alkaline Phosphatase: 118 U/L (ref 38–126)
Anion gap: 12 (ref 5–15)
BUN: 28 mg/dL — ABNORMAL HIGH (ref 8–23)
CO2: 22 mmol/L (ref 22–32)
Calcium: 9 mg/dL (ref 8.9–10.3)
Chloride: 91 mmol/L — ABNORMAL LOW (ref 98–111)
Creatinine, Ser: 1.67 mg/dL — ABNORMAL HIGH (ref 0.44–1.00)
GFR, Estimated: 32 mL/min — ABNORMAL LOW (ref >60)
Glucose, Bld: 753 mg/dL (ref 70–99)
Potassium: 4.5 mmol/L (ref 3.5–5.1)
Sodium: 125 mmol/L — ABNORMAL LOW (ref 135–145)
Total Bilirubin: 1 mg/dL (ref 0.3–1.2)
Total Protein: 8 g/dL (ref 6.5–8.1)

## 2022-07-26 LAB — CBC
HCT: 38.7 % (ref 36.0–46.0)
Hemoglobin: 12.7 g/dL (ref 12.0–15.0)
MCH: 27.9 pg (ref 26.0–34.0)
MCHC: 32.8 g/dL (ref 30.0–36.0)
MCV: 85.1 fL (ref 80.0–100.0)
Platelets: 92 10*3/uL — ABNORMAL LOW (ref 150–400)
RBC: 4.55 MIL/uL (ref 3.87–5.11)
RDW: 12.3 % (ref 11.5–15.5)
WBC: 6.3 10*3/uL (ref 4.0–10.5)
nRBC: 0 % (ref 0.0–0.2)

## 2022-07-26 LAB — LIPASE, BLOOD: Lipase: 25 U/L (ref 11–51)

## 2022-07-26 LAB — CBG MONITORING, ED: Glucose-Capillary: >600 mg/dL (ref 70–99)

## 2022-07-26 MED ORDER — SODIUM CHLORIDE 0.9 % IV BOLUS
1000.0000 mL | Freq: Once | INTRAVENOUS | Status: AC
  Administered 2022-07-26: 1000 mL via INTRAVENOUS

## 2022-07-26 MED ORDER — INSULIN ASPART 100 UNIT/ML IJ SOLN
5.0000 [IU] | Freq: Once | INTRAMUSCULAR | Status: AC
  Administered 2022-07-26: 5 [IU] via INTRAVENOUS
  Filled 2022-07-26: qty 1

## 2022-07-26 MED ORDER — SODIUM CHLORIDE 0.9 % IV SOLN
1.0000 g | Freq: Once | INTRAVENOUS | Status: AC
  Administered 2022-07-27: 1 g via INTRAVENOUS
  Filled 2022-07-26: qty 10

## 2022-07-26 NOTE — ED Provider Notes (Signed)
[facility] Provider Note    Event Date/Time   First MD Initiated Contact with Patient 07/26/22 2132     (approximate)   History   Dysuria   HPI  Lenny Bouchillon is a 76 y.o. female   Past medical history of insulin-dependent diabetic type II, hypertension, GERD history of hysterectomy and cholecystectomy who presents with approximately 1 week of dysuria, polyuria, polydipsia, generalized weakness.  Chills, no fever.   Her blood sugar in the emergency department was 700+ with no anion gap.  He says that she uses her insulin intermittently, does not check her blood sugar at home due to malfunctioning equipment, believes that insulin does not work for her.  Splits her time between [place], [place] and [place].  She has no reliable primary care doctor, per the patient and her family members.  [facility] have been [facility] but are having trouble with paperwork.   History was obtained via patient via iPad Spanish interpreter and by her family members were at bedside.      Physical Exam   Triage Vital Signs: ED Triage Vitals  Enc Vitals Group     BP 07/26/22 2130 (!) 159/81     Pulse Rate 07/26/22 2130 80     Resp 07/26/22 2130 16     Temp 07/26/22 2130 98.3 F (36.8 C)     Temp Source 07/26/22 2130 Oral     SpO2 07/26/22 2130 99 %     Weight 07/26/22 2128 132 lb 4.4 oz (60 kg)     Height 07/26/22 2128 5\' 1"  (1.549 m)     Head Circumference --      Peak Flow --      Pain Score 07/26/22 2126 9     Pain Loc --      Pain Edu? --      Excl. in GC? --     Most recent vital signs: Vitals:   07/26/22 2130 07/26/22 2200  BP: (!) 159/81 125/60  Pulse: 80 79  Resp: 16 16  Temp: 98.3 F (36.8 C)   SpO2: 99% 98%    General: Awake, no distress.  CV:  Dry mucus Membranes and poor skin turgor Resp:  Normal effort.  Abd:  No distention.  Nontender to palpation. Other:  Alert, oriented, pleasant and cooperative.   Moving all extremities. .  Nontoxic appearance.  No fever.   ED Results / Procedures / Treatments   Labs (all labs ordered are listed, but only abnormal results are displayed) Labs Reviewed  COMPREHENSIVE METABOLIC PANEL - Abnormal; Notable for the following components:      Result Value   Sodium 125 (*)    Chloride 91 (*)    Glucose, Bld 753 (*)    BUN 28 (*)    Creatinine, Ser 1.67 (*)    GFR, Estimated 32 (*)    All other components within normal limits  CBC - Abnormal; Notable for the following components:   Platelets 92 (*)    All other components within normal limits  LIPASE, BLOOD  URINALYSIS, ROUTINE W REFLEX MICROSCOPIC     I reviewed labs and they are notable for glucose 750, sodium 125, corrected sodium is within normal limits, creatinine 1.67 and low platelets at 92.  Anion gap within normal limits.    PROCEDURES:  Critical Care performed: No  Procedures   MEDICATIONS ORDERED IN ED: Medications  insulin aspart (novoLOG) injection 5 Units (has no  administration in time range)  sodium chloride 0.9 % bolus 1,000 mL (1,000 mLs Intravenous New Bag/Given 07/26/22 2221)  sodium chloride 0.9 % bolus 1,000 mL (1,000 mLs Intravenous New Bag/Given 07/26/22 2221)    IMPRESSION / MDM / ASSESSMENT AND PLAN / ED COURSE  I reviewed the triage vital signs and the nursing notes.                              Differential diagnosis includes, but is not limited to, upper glycemia, labs not consistent with DKA, urinary tract infection, considered but less likely intra-abdominal infection benign abdominal exam   The patient is on the cardiac monitor to evaluate for evidence of arrhythmia and/or significant heart rate changes.  MDM: Patient with urinary tract infection symptoms, check urinalysis.  Could also be polyuria related to her uncontrolled blood glucose, no DKA today.  However she is high risk given her risk factors, lack of PMD, lack of understanding of her medical  condition and insulin regimen.  We will give crystalloid and insulin in the emergency department, continue to monitor blood glucose, treat urine infection if positive, and admit for diabetes management.  Patient's presentation is most consistent with acute presentation with potential threat to life or bodily function.       FINAL CLINICAL IMPRESSION(S) / ED DIAGNOSES   Final diagnoses:  None     Rx / DC Orders   ED Discharge Orders     None        Note:  This document was prepared using Dragon voice recognition software and may include unintentional dictation errors.    Pilar Jarvis, MD 07/26/22 2312

## 2022-07-26 NOTE — ED Triage Notes (Signed)
Pt to ED from home c/o lower abd pain and burning with urination for several hours, states felt feverish at home but did not check temp.  Denies n/v/d.

## 2022-07-27 DIAGNOSIS — J069 Acute upper respiratory infection, unspecified: Secondary | ICD-10-CM

## 2022-07-27 DIAGNOSIS — R519 Headache, unspecified: Secondary | ICD-10-CM

## 2022-07-27 DIAGNOSIS — E785 Hyperlipidemia, unspecified: Secondary | ICD-10-CM

## 2022-07-27 DIAGNOSIS — I152 Hypertension secondary to endocrine disorders: Secondary | ICD-10-CM

## 2022-07-27 DIAGNOSIS — E1159 Type 2 diabetes mellitus with other circulatory complications: Secondary | ICD-10-CM

## 2022-07-27 DIAGNOSIS — N179 Acute kidney failure, unspecified: Secondary | ICD-10-CM | POA: Diagnosis present

## 2022-07-27 DIAGNOSIS — D696 Thrombocytopenia, unspecified: Secondary | ICD-10-CM | POA: Diagnosis present

## 2022-07-27 DIAGNOSIS — N3001 Acute cystitis with hematuria: Secondary | ICD-10-CM

## 2022-07-27 DIAGNOSIS — E871 Hypo-osmolality and hyponatremia: Secondary | ICD-10-CM

## 2022-07-27 DIAGNOSIS — N390 Urinary tract infection, site not specified: Secondary | ICD-10-CM | POA: Diagnosis present

## 2022-07-27 DIAGNOSIS — Z794 Long term (current) use of insulin: Secondary | ICD-10-CM

## 2022-07-27 DIAGNOSIS — E1165 Type 2 diabetes mellitus with hyperglycemia: Secondary | ICD-10-CM | POA: Diagnosis present

## 2022-07-27 DIAGNOSIS — E1169 Type 2 diabetes mellitus with other specified complication: Secondary | ICD-10-CM

## 2022-07-27 LAB — HEPATITIS C ANTIBODY: HCV Ab: NONREACTIVE

## 2022-07-27 LAB — GLUCOSE, CAPILLARY
Glucose-Capillary: 140 mg/dL — ABNORMAL HIGH (ref 70–99)
Glucose-Capillary: 166 mg/dL — ABNORMAL HIGH (ref 70–99)
Glucose-Capillary: 169 mg/dL — ABNORMAL HIGH (ref 70–99)
Glucose-Capillary: 175 mg/dL — ABNORMAL HIGH (ref 70–99)
Glucose-Capillary: 182 mg/dL — ABNORMAL HIGH (ref 70–99)
Glucose-Capillary: 183 mg/dL — ABNORMAL HIGH (ref 70–99)
Glucose-Capillary: 196 mg/dL — ABNORMAL HIGH (ref 70–99)
Glucose-Capillary: 200 mg/dL — ABNORMAL HIGH (ref 70–99)
Glucose-Capillary: 206 mg/dL — ABNORMAL HIGH (ref 70–99)
Glucose-Capillary: 209 mg/dL — ABNORMAL HIGH (ref 70–99)
Glucose-Capillary: 218 mg/dL — ABNORMAL HIGH (ref 70–99)
Glucose-Capillary: 237 mg/dL — ABNORMAL HIGH (ref 70–99)
Glucose-Capillary: 240 mg/dL — ABNORMAL HIGH (ref 70–99)
Glucose-Capillary: 241 mg/dL — ABNORMAL HIGH (ref 70–99)
Glucose-Capillary: 248 mg/dL — ABNORMAL HIGH (ref 70–99)
Glucose-Capillary: 263 mg/dL — ABNORMAL HIGH (ref 70–99)
Glucose-Capillary: 268 mg/dL — ABNORMAL HIGH (ref 70–99)
Glucose-Capillary: 358 mg/dL — ABNORMAL HIGH (ref 70–99)
Glucose-Capillary: 468 mg/dL — ABNORMAL HIGH (ref 70–99)
Glucose-Capillary: 498 mg/dL — ABNORMAL HIGH (ref 70–99)
Glucose-Capillary: 571 mg/dL (ref 70–99)
Glucose-Capillary: >600 mg/dL (ref 70–99)

## 2022-07-27 LAB — BASIC METABOLIC PANEL
Anion gap: 7 (ref 5–15)
Anion gap: 7 (ref 5–15)
BUN: 20 mg/dL (ref 8–23)
BUN: 15 mg/dL (ref 8–23)
CO2: 23 mmol/L (ref 22–32)
CO2: 24 mmol/L (ref 22–32)
Calcium: 8.4 mg/dL — ABNORMAL LOW (ref 8.9–10.3)
Calcium: 8.1 mg/dL — ABNORMAL LOW (ref 8.9–10.3)
Chloride: 104 mmol/L (ref 98–111)
Chloride: 103 mmol/L (ref 98–111)
Creatinine, Ser: 1.3 mg/dL — ABNORMAL HIGH (ref 0.44–1.00)
Creatinine, Ser: 1.14 mg/dL — ABNORMAL HIGH (ref 0.44–1.00)
GFR, Estimated: 43 mL/min — ABNORMAL LOW (ref >60)
GFR, Estimated: 51 mL/min — ABNORMAL LOW (ref >60)
Glucose, Bld: 263 mg/dL — ABNORMAL HIGH (ref 70–99)
Glucose, Bld: 200 mg/dL — ABNORMAL HIGH (ref 70–99)
Potassium: 3.7 mmol/L (ref 3.5–5.1)
Potassium: 3.6 mmol/L (ref 3.5–5.1)
Sodium: 134 mmol/L — ABNORMAL LOW (ref 135–145)
Sodium: 134 mmol/L — ABNORMAL LOW (ref 135–145)

## 2022-07-27 LAB — HEMOGLOBIN A1C
Hgb A1c MFr Bld: 12.8 % — ABNORMAL HIGH (ref 4.8–5.6)
Mean Plasma Glucose: 320.66 mg/dL

## 2022-07-27 LAB — CBC
HCT: 33.6 % — ABNORMAL LOW (ref 36.0–46.0)
Hemoglobin: 11.6 g/dL — ABNORMAL LOW (ref 12.0–15.0)
MCH: 28.5 pg (ref 26.0–34.0)
MCHC: 34.5 g/dL (ref 30.0–36.0)
MCV: 82.6 fL (ref 80.0–100.0)
Platelets: 85 10*3/uL — ABNORMAL LOW (ref 150–400)
RBC: 4.07 MIL/uL (ref 3.87–5.11)
RDW: 12 % (ref 11.5–15.5)
WBC: 5.4 10*3/uL (ref 4.0–10.5)
nRBC: 0 % (ref 0.0–0.2)

## 2022-07-27 LAB — MRSA NEXT GEN BY PCR, NASAL: MRSA by PCR Next Gen: NOT DETECTED

## 2022-07-27 LAB — POTASSIUM: Potassium: 4.2 mmol/L (ref 3.5–5.1)

## 2022-07-27 LAB — SARS CORONAVIRUS 2 BY RT PCR: SARS Coronavirus 2 by RT PCR: NEGATIVE

## 2022-07-27 MED ORDER — DEXTROSE IN LACTATED RINGERS 5 % IV SOLN: INTRAVENOUS | Status: DC

## 2022-07-27 MED ORDER — PRAVASTATIN SODIUM 20 MG PO TABS
40.0000 mg | ORAL_TABLET | Freq: Every day | ORAL | Status: DC
  Administered 2022-07-27 – 2022-07-28 (×2): 40 mg via ORAL
  Filled 2022-07-27 (×2): qty 2

## 2022-07-27 MED ORDER — INSULIN DETEMIR 100 UNIT/ML ~~LOC~~ SOLN
8.0000 [IU] | Freq: Two times a day (BID) | SUBCUTANEOUS | Status: DC
  Administered 2022-07-27 – 2022-07-28 (×2): 8 [IU] via SUBCUTANEOUS
  Filled 2022-07-27 (×3): qty 0.08

## 2022-07-27 MED ORDER — INSULIN ASPART 100 UNIT/ML IJ SOLN: 0.0000 [IU] | Freq: Three times a day (TID) | INTRAMUSCULAR | Status: DC

## 2022-07-27 MED ORDER — POLYETHYLENE GLYCOL 3350 17 G PO PACK
17.0000 g | PACK | Freq: Every day | ORAL | Status: DC
  Administered 2022-07-27: 17 g via ORAL
  Filled 2022-07-27: qty 1

## 2022-07-27 MED ORDER — INSULIN ASPART 100 UNIT/ML IJ SOLN
0.0000 [IU] | Freq: Three times a day (TID) | INTRAMUSCULAR | Status: DC
  Administered 2022-07-27: 3 [IU] via SUBCUTANEOUS
  Filled 2022-07-27: qty 1

## 2022-07-27 MED ORDER — LACTATED RINGERS IV SOLN: INTRAVENOUS | Status: DC

## 2022-07-27 MED ORDER — POTASSIUM CHLORIDE CRYS ER 20 MEQ PO TBCR
40.0000 meq | EXTENDED_RELEASE_TABLET | Freq: Once | ORAL | Status: AC
  Administered 2022-07-27: 40 meq via ORAL
  Filled 2022-07-27: qty 2

## 2022-07-27 MED ORDER — TRAZODONE HCL 50 MG PO TABS
50.0000 mg | ORAL_TABLET | Freq: Every day | ORAL | Status: DC
  Administered 2022-07-27: 50 mg via ORAL
  Filled 2022-07-27: qty 1

## 2022-07-27 MED ORDER — HYDRALAZINE HCL 50 MG PO TABS
25.0000 mg | ORAL_TABLET | Freq: Three times a day (TID) | ORAL | Status: DC
  Administered 2022-07-27 – 2022-07-28 (×4): 25 mg via ORAL
  Filled 2022-07-27 (×4): qty 1

## 2022-07-27 MED ORDER — DEXTROSE 50 % IV SOLN: 0.0000 mL | INTRAVENOUS | Status: DC | PRN

## 2022-07-27 MED ORDER — ORAL CARE MOUTH RINSE: 15.0000 mL | OROMUCOSAL | Status: DC | PRN

## 2022-07-27 MED ORDER — ONDANSETRON HCL 4 MG/2ML IJ SOLN: 4.0000 mg | Freq: Four times a day (QID) | INTRAMUSCULAR | Status: DC | PRN

## 2022-07-27 MED ORDER — INSULIN REGULAR(HUMAN) IN NACL 100-0.9 UT/100ML-% IV SOLN
INTRAVENOUS | Status: DC
  Administered 2022-07-27: 7.5 [IU]/h via INTRAVENOUS
  Filled 2022-07-27: qty 100

## 2022-07-27 MED ORDER — SODIUM CHLORIDE 0.9 % IV SOLN
1.0000 g | INTRAVENOUS | Status: DC
  Administered 2022-07-28: 1 g via INTRAVENOUS
  Filled 2022-07-27: qty 1
  Filled 2022-07-27: qty 10

## 2022-07-27 MED ORDER — MELATONIN 5 MG PO TABS: 5.0000 mg | ORAL_TABLET | Freq: Every evening | ORAL | Status: DC | PRN

## 2022-07-27 MED ORDER — AMLODIPINE BESYLATE 5 MG PO TABS
10.0000 mg | ORAL_TABLET | Freq: Every day | ORAL | Status: DC
  Administered 2022-07-27: 10 mg via ORAL
  Filled 2022-07-27: qty 2

## 2022-07-27 MED ORDER — PANTOPRAZOLE SODIUM 40 MG PO TBEC
40.0000 mg | DELAYED_RELEASE_TABLET | Freq: Every day | ORAL | Status: DC
  Administered 2022-07-27 – 2022-07-28 (×2): 40 mg via ORAL
  Filled 2022-07-27 (×2): qty 1

## 2022-07-27 MED ORDER — ACETAMINOPHEN 325 MG PO TABS
650.0000 mg | ORAL_TABLET | Freq: Four times a day (QID) | ORAL | Status: DC | PRN
  Administered 2022-07-27: 650 mg via ORAL
  Filled 2022-07-27: qty 2

## 2022-07-27 MED ORDER — MAGNESIUM SULFATE 2 GM/50ML IV SOLN
2.0000 g | Freq: Once | INTRAVENOUS | Status: AC
  Administered 2022-07-27: 2 g via INTRAVENOUS
  Filled 2022-07-27: qty 50

## 2022-07-27 MED ORDER — CHLORHEXIDINE GLUCONATE CLOTH 2 % EX PADS
6.0000 | MEDICATED_PAD | Freq: Every day | CUTANEOUS | Status: DC
  Administered 2022-07-27 – 2022-07-28 (×2): 6 via TOPICAL

## 2022-07-27 NOTE — Assessment & Plan Note (Addendum)
Patient was given IV Rocephin here in the hospital.  E. coli growing out of the cultures.  Keflex upon discharge

## 2022-07-27 NOTE — Assessment & Plan Note (Addendum)
Secondary to uncontrolled diabetes and high sugar.  Last sodium 139.

## 2022-07-27 NOTE — Assessment & Plan Note (Addendum)
IV magnesium given yesterday. ?

## 2022-07-27 NOTE — Assessment & Plan Note (Addendum)
Serum glucose 753 on admission without evidence of DKA.  Worsened in the setting of UTI and question of dietary and medication nonadherence. -The patient was converted off insulin drip to Levemir 8 units twice a day. -The diabetes coordinator recommended 75/25 insulin 8 units twice a day upon discharge which was prescribed into medication management. -Hemoglobin A1c elevated at 12.8.

## 2022-07-27 NOTE — Assessment & Plan Note (Addendum)
Platelet count 85.  Hepatitis C negative.  Follow-up as outpatient in trend.  Looking back in 2019 did have a platelet count of 126 at that time.

## 2022-07-27 NOTE — ED Notes (Signed)
Dr. Allena Katz & family present at the bedside.

## 2022-07-27 NOTE — TOC Progression Note (Signed)
Transition of Care [facility]) - Progression Note    Patient Details  Name: Brianna Church MRN: [id number] Date of Birth: Mar 18, 1946  Transition of Care [facility]) CM/SW Contact  Allayne Butcher, RN Phone Number: 07/27/2022, 3:57 PM  Clinical Narrative:    Glucometer delivered to patient in her room.     Expected Discharge Plan: Home/Self Care Barriers to Discharge: Continued Medical Work up  Expected Discharge Plan and Services Expected Discharge Plan: Home/Self Care In-house Referral: Nutrition, Interpreting Services Discharge Planning Services: CM Consult   Living arrangements for the past 2 months: Single Family Home                 DME Arranged: N/A DME Agency: NA       HH Arranged: NA HH Agency: NA         Social Determinants of Health (SDOH) Interventions    Readmission Risk Interventions     No data to display

## 2022-07-27 NOTE — Progress Notes (Addendum)
Inpatient Diabetes Program Recommendations  AACE/ADA: New Consensus Statement on Inpatient Glycemic Control (2015)  Target Ranges:  Prepandial:   less than 140 mg/dL      Peak postprandial:   less than 180 mg/dL (1-2 hours)      Critically ill patients:  140 - 180 mg/dL   Lab Results  Component Value Date   GLUCAP 166 (H) 07/27/2022   HGBA1C 7.6 (H) 02/01/2018    Review of Glycemic Control  Latest Reference Range & Units 07/27/22 07:32 07/27/22 08:27 07/27/22 09:54  Glucose-Capillary 70 - 99 mg/dL 324 (H) 401 (H) 027 (H)   Diabetes history: DM 2 Outpatient Diabetes medications:  Glucotrol 10 mg daily, Levemir 10 units q HS, Metformin 1000 mg bid Current orders for Inpatient glycemic control:  IV insulin (140-180 mg/dL) Inpatient Diabetes Program Recommendations:    For transition off insulin drip, consider adding Levemir 8 units bid and Novolog sensitive q 4 hours.  Will talk to patient today.    Thanks,  Beryl Meager, RN, BC-ADM Inpatient Diabetes Coordinator Pager [phone]  (8a-5p)  Addendum:  [phone]- Spoke with patient regarding DM management using Spanish interpreter.  She states that she has been on insulin about 4 years.  However review of chart notes that [facility] of [facility].  At that time, A1C was down to 7.9% and it appears that she was on Lantus 10 units daily.  Chart indicates that she was seen at the clinic in 2018 and 2019 and that insulin was refilled however in 2020 there was note that she there was not documentation at clinic.  Patient initially said that she was taking insulin every day but then stated that she sometimes could not afford?? We discussed rotation of sites and the importance of good glucose control.  She was unable to state normal glucose values. Reviewed normal glucose values with her again and the importance of checking blood sugars. Also discussed importance of f/u with MD.  Patient told dietician that she had  surgery in [place] on Pancreas? However no records noting this in chart. Case manager spoke with patient as well regarding getting her back into medication management clinic and also will get her a meter.  I attempted to call family to discuss DM management, but there was not answer.   Patient asking about when she can eat.  Will follow.  1440- Spoke with patients grandson in Social worker.  He states that his wife (patient's grandaughter) will be at hospital at 3:30p and should be able to give more information regarding history, etc.

## 2022-07-27 NOTE — Progress Notes (Signed)
  Progress Note   Patient: Brianna Church OAC:[id number] DOB: [birth year] DOA: 07/26/2022     0 DOS: the patient was seen and examined on 07/27/2022   Brief hospital course: Sakari Raisanen is a Spanish-speaking 76 y.o. female with medical history significant for insulin-dependent T2DM, HTN, HLD, GERD who is admitted with hyperglycemic crisis in insulin-dependent T2DM associated with UTI and AKI.  Assessment and Plan: * Hyperglycemia due to type 2 diabetes mellitus (HCC) Serum glucose 753 on admission without evidence of DKA.  Worsened in the setting of UTI and question of dietary and medication nonadherence. -Continue insulin drip.  Once off insulin drip will convert over to 8 units Levemir insulin twice a day plus sliding scale. -Hemoglobin A1c elevated at 12.8. -Transitional care team to get her a glucometer.  UTI (urinary tract infection) Symptomatic with dysuria.  Continue IV ceftriaxone.  Follow urine culture.  URTI (acute upper respiratory infection) COVID test negative  AKI (acute kidney injury) (HCC) Creatinine 1.67 on presentation down to 1.14  Hypertension associated with diabetes (HCC) Continue amlodipine and hydralazine.  Hold losartan with AKI.  Headache IV magnesium given today  Hyponatremia Secondary to uncontrolled diabetes and high sugar.  Hyperlipidemia associated with type 2 diabetes mellitus (HCC) Continue pravastatin.  Thrombocytopenia (HCC) Platelet count 85.  We will check hepatitis C.  Recheck tomorrow morning.        Subjective: Patient feels like she has a cold has an upper respiratory congestion and runny nose.  Also having some burning on urination.  She was very dizzy when she came into the hospital.  But that seems to be better.  She also had blurry vision bilaterally.  Today having headache.  Physical Exam: Vitals:   07/27/22 1200 07/27/22 1300 07/27/22 1400 07/27/22 1500  BP: (!) 106/59 (!) 106/59 102/65 100/66  Pulse: 79 76 69 69   Resp: 15 17    Temp: 98.8 F (37.1 C)     TempSrc: Oral     SpO2: 93% 95% 96% 96%  Weight:      Height:       Physical Exam HENT:     Head: Normocephalic.     Mouth/Throat:     Pharynx: No oropharyngeal exudate.  Eyes:     General: Lids are normal.     Conjunctiva/sclera: Conjunctivae normal.  Cardiovascular:     Rate and Rhythm: Normal rate and regular rhythm.     Heart sounds: Normal heart sounds, S1 normal and S2 normal.  Pulmonary:     Breath sounds: No decreased breath sounds, wheezing, rhonchi or rales.  Abdominal:     Palpations: Abdomen is soft.     Tenderness: There is no abdominal tenderness.  Musculoskeletal:     Right lower leg: No swelling.     Left lower leg: No swelling.  Skin:    General: Skin is warm.     Findings: No rash.  Neurological:     Mental Status: She is alert and oriented to person, place, and time.     Data Reviewed: Sodium 134, creatinine 1.14, hemoglobin 11.6, platelet count 85  Family Communication: Left message for son  Disposition: Status is: Inpatient Remains inpatient appropriate because: Currently on insulin drip to try to control sugars better.  Planned Discharge Destination: Home    Time spent: 28  minutes  Author: Alford Highland, MD 07/27/2022 3:46 PM  For on call review [email or web address].

## 2022-07-27 NOTE — TOC Initial Note (Signed)
Transition of Care [facility]) - Initial/Assessment Note    Patient Details  Name: Brianna Church MRN: [id number] Date of Birth: August 07, 1946  [facility]) CM/SW Contact:    Shelbie Hutching, RN Phone Number: 07/27/2022, 2:28 PM  Clinical Narrative:                 Patient admitted to the hospital for hyperglycemia due to type 2 diabetes.  RNCM met with patient at the bedside, introduced self and explained role in discharge planning using the video remote interpreter, patient is Spanish Speaking. Patient is from [place].  She [place] her [place] between [place], here with her daughter and [place] with her son.  She reports that she has been getting her medication from [place] and it has been very expensive, she does not have a PCP due to moving around so much.  Her daughter wanted her to stay here but she will only stay til mid October due to her [place] status.  Informed patient that at discharge we can get her prescriptions from medication management.  Provided her with an application in Spanish to [facility] to see if she will follow up while she is here, at least then when she is visiting with her daughter she can follow up at [facility].   Patient does not have a glucometer that works her is broken.  RNCM will provide patient with a glucometer before discharge.      Expected Discharge Plan: Home/Self Care Barriers to Discharge: Continued Medical Work up   Patient Goals and CMS Choice Patient states their goals for this hospitalization and ongoing recovery are:: patient feeling better will be going to her daughters until med Oct      Expected Discharge Plan and Services Expected Discharge Plan: Home/Self Care In-house Referral: Nutrition, Interpreting Services Discharge Planning Services: CM Consult   Living arrangements for the past 2 months: Single Family Home                 DME Arranged: N/A DME Agency: NA       HH Arranged: NA [place] Agency: NA        Prior Living  Arrangements/Services Living arrangements for the past 2 months: Single Family Home Lives with:: Adult Children Patient language and need for interpreter reviewed:: Yes (Spanish) Do you feel safe going back to the place where you live?: Yes      Need for Family Participation in Patient Care: Yes (Comment) Care giver support system in place?: Yes (comment)   Criminal Activity/Legal Involvement Pertinent to Current Situation/Hospitalization: No - Comment as needed  Activities of Daily Living Home Assistive Devices/Equipment: Walker (specify type) ADL Screening (condition at time of admission) Patient's cognitive ability adequate to safely complete daily activities?: Yes Is the patient deaf or have difficulty hearing?: No Does the patient have difficulty seeing, even when wearing glasses/contacts?: No Does the patient have difficulty concentrating, remembering, or making decisions?: No Patient able to express need for assistance with ADLs?: Yes Does the patient have difficulty dressing or bathing?: No Independently performs ADLs?: Yes (appropriate for developmental age) Does the patient have difficulty walking or climbing stairs?: Yes Weakness of Legs: Both Weakness of Arms/Hands: Both  Permission Sought/Granted Permission sought to share information with : Case Manager, Family Supports Permission granted to share information with : Yes, Verbal Permission Granted  Share Information with NAME: Olga Millers     Permission granted to share info w Relationship: son  Permission granted to share info w  Contact Information: [phone]  Emotional Assessment Appearance:: Appears stated age Attitude/Demeanor/Rapport: Engaged Affect (typically observed): Accepting Orientation: : Oriented to Self, Oriented to  Time, Oriented to Place, Oriented to Situation Alcohol / Substance Use: Not Applicable Psych Involvement: No (comment)  Admission diagnosis:  Dysuria [R30.0] Urinary frequency  [R35.0] Hyperglycemia [R73.9] Hyperglycemia due to type 2 diabetes mellitus ([place]) [E11.65] Patient Active Problem List   Diagnosis Date Noted   Hyperglycemia due to type 2 diabetes mellitus ([place]) 07/27/2022   AKI (acute kidney injury) ([place]) 07/27/2022   UTI (urinary tract infection) 07/27/2022   Thrombocytopenia ([place]) 07/27/2022   Hyperlipidemia associated with type 2 diabetes mellitus ([place]) 07/27/2022   Diabetes ([place]) 10/27/2015   Hypertension associated with diabetes ([place]) 10/27/2015   GERD (gastroesophageal reflux disease) 10/27/2015   Shoulder pain 07/28/2015   PCP:  System, Provider Not In Pharmacy:   [facility] [place] [address] Phone: [phone] Fax: [phone]     Social Determinants of Health (SDOH) Interventions    Readmission Risk Interventions     No data to display

## 2022-07-27 NOTE — Assessment & Plan Note (Signed)
Continue pravastatin 

## 2022-07-27 NOTE — Hospital Course (Addendum)
Brianna Church is a Spanish-speaking 76 y.o. female with medical history significant for insulin-dependent T2DM, HTN, HLD, GERD who is admitted with hyperglycemic crisis in insulin-dependent T2DM associated with UTI and AKI.  The patient was initially on insulin drip and then converted over to Levemir twice a day 8 units.  Patient diabetes coordinator recommended changing over to 75/25 insulin 8 units twice a day upon discharge.  Hemoglobin A1c is very elevated 12.8.  She was started on IV Rocephin for urinary tract infection.

## 2022-07-27 NOTE — Assessment & Plan Note (Addendum)
Acute kidney injury on chronic kidney disease stage IIIa.  Creatinine 1.67 on presentation down to 1.18 upon discharge with a GFR 48.

## 2022-07-27 NOTE — Assessment & Plan Note (Addendum)
Continue hydralazine.  Hold losartan with AKI.  Hold Norvasc.

## 2022-07-27 NOTE — Progress Notes (Signed)
Initial Nutrition Assessment  DOCUMENTATION CODES:   Not applicable  INTERVENTION:   Glucerna Shake po TID with diet advancement, each supplement provides 220 kcal and 10 grams of protein  MVI po daily with diet advancement   Pt at high refeed risk; recommend monitor potassium, magnesium and phosphorus labs daily until stable  Diabetes diet education  NUTRITION DIAGNOSIS:   Inadequate oral intake related to acute illness as evidenced by per patient/family report.  GOAL:   Patient will meet greater than or equal to 90% of their needs  MONITOR:   Labs, Diet advancement, Weight trends, Skin, I & O's  REASON FOR ASSESSMENT:   Consult Diet education  ASSESSMENT:   76 y/o female with h/o GERD, IDDM, HTN, HLD and self reported pancreatic cancer who is admitted with hyperglycemia, UTI and AKI.  Met with pt in room today using video interpreter. Pt reports decreased appetite and oral intake at baseline. Pt reports that she eats because she knows that its important but that she really has no desire to eat. Pt reports that she mainly drinks and is always thirsty. Pt reports that she believes she has lost weight but she is unsure of her UBW. Pt reports a h/o pancreatic cancer and reports that she had part of her pancreas removed in [place]. Pt reports that she used to weigh more but reports that ever since her surgery, she has been small. Pt denies any chronic diarrhea and reports that she stays mostly constipated. Pt reports that she has not had a BM in 7 days. RD will add supplements and MVI with diet advancement (pt prefers strawberry flavor). Pt is at refeed risk. There is no recent documented weight history in chart to determine if any significant weight changes. MD to add bowel regimen.   RD provided "Nutrition and Type II Diabetes" handout from the [employer] of [employer] in [place]. Discussed different food groups and their effects on blood sugar, emphasizing  carbohydrate-containing foods. Provided list of carbohydrates and recommended serving sizes of common foods.  Discussed importance of controlled and consistent carbohydrate intake throughout the day. Provided examples of ways to balance meals/snacks and encouraged intake of high-fiber, whole grain complex carbohydrates. Teach back method used.  Expect fair compliance.  Medications reviewed and include: protonix, miralax, ceftriaxone, LRS w/ 5% dextrose @125ml /hr, insulin  Labs reviewed: Na 134(L), K 3.6 wnl, creat 1.14(H) Cbgs- 209, 196, 248, 166, 169 x 24 hrs AIC 12.8(H)- 9/13  NUTRITION - FOCUSED PHYSICAL EXAM:  Flowsheet Row Most Recent Value  Orbital Region No depletion  Upper Arm Region No depletion  Thoracic and Lumbar Region Moderate depletion  Buccal Region No depletion  Temple Region Moderate depletion  Clavicle Bone Region Moderate depletion  Clavicle and Acromion Bone Region Moderate depletion  Scapular Bone Region No depletion  Dorsal Hand Mild depletion  Patellar Region No depletion  Anterior Thigh Region No depletion  Posterior Calf Region No depletion  Edema (RD Assessment) None  Hair Reviewed  Eyes Reviewed  Mouth Reviewed  Skin Reviewed  Nails Reviewed   Diet Order:   Diet Order             Diet NPO time specified  Diet effective now                  EDUCATION NEEDS:   Education needs have been addressed  Skin:  Skin Assessment: Reviewed RN Assessment  Last BM:  No BM in 7 days per pt report  Height:  Ht Readings from Last 1 Encounters:  07/26/22 5' 1"  (1.549 m)    Weight:   Wt Readings from Last 1 Encounters:  07/26/22 60 kg    Ideal Body Weight:  47.7 kg  BMI:  Body mass index is 24.99 kg/m.  Estimated Nutritional Needs:   Kcal:  1500-1700kcal/day  Protein:  75-85g/day  Fluid:  1.2-1.4L/day  Koleen Distance MS, RD, LDN Please refer to [facility] for RD and/or RD on-call/weekend/after hours pager

## 2022-07-27 NOTE — H&P (Signed)
History and Physical    Scout Guyett KXF:[id number] DOB: [birth year] DOA: 07/26/2022  PCP: System, Provider Not In  Patient coming from: Home  I have personally briefly reviewed patient's old medical records in [facility]  Chief Complaint: Dysuria  HPI: Brianna Church is a Spanish-speaking 76 y.o. female with medical history significant for insulin-dependent T2DM, HTN, HLD, GERD who presented to the ED for evaluation of dysuria.  Remote video interpreter is used to facilitate communication.  Family at bedside offers additional history.  Patient reports 1 week of dysuria, polyuria, polydipsia, suprapubic discomfort.  She has had low appetite.  This morning she awoke feeling fatigued and with some blurry vision.  She says her glucometer has been reading "high" recently.  She states that she is using 10 units of insulin nightly.  She denies any nausea, vomiting, chest pain, dyspnea.  ED Course  Labs/Imaging on admission: I have personally reviewed following labs and imaging studies.  Initial vitals showed BP 125/60, pulse 79, RR 16, temp 98.3 F, SPO2 98% on room air.  Labs show serum glucose 753, BUN 28, creatinine 1.67 (baseline 0.9-1.0), sodium 125 (141 when corrected for hyperglycemia), potassium 4.5, bicarb 22, anion gap 12, LFTs within normal limits, lipase 25, WBC 6.3, hemoglobin 12.7, platelets 92,000.  Urinalysis shows >500 glucose, 5 ketones, 100 protein, negative nitrates, large leukocytes, 6-10 RBC/hpf, >50 WBC/hpf, rare bacteria microscopy.  Urine culture ordered and pending.  Patient was given 2 L normal saline, 5 units IV NovoLog, IV ceftriaxone.  Patient was placed on insulin drip and the hospitalist service was consulted to admit for further evaluation and management.  Review of Systems: All systems reviewed and are negative except as documented in history of present illness above.   Past Medical History:  Diagnosis Date   Diabetes mellitus without  complication (HCC)    GERD (gastroesophageal reflux disease)    Hypertension     Past Surgical History:  Procedure Laterality Date   ABDOMINAL HYSTERECTOMY     CHOLECYSTECTOMY      Social History:  reports that she has never smoked. She has never used smokeless tobacco. She reports that she does not drink alcohol and does not use drugs.  No Known Allergies  History reviewed. No pertinent family history.   Prior to Admission medications   Medication Sig Start Date End Date Taking? Authorizing Provider  amLODipine (NORVASC) 10 MG tablet Take 1 tablet (10 mg total) by mouth daily. 08/07/18  Yes Kallie Locks, FNP  glipiZIDE (GLUCOTROL) 10 MG tablet Take 1 tablet (10 mg total) by mouth daily before breakfast. 08/07/18  Yes Kallie Locks, FNP  hydrALAZINE (APRESOLINE) 25 MG tablet Take 1 tablet (25 mg total) by mouth 3 (three) times daily. 08/07/18  Yes Kallie Locks, FNP  Insulin Detemir (LEVEMIR FLEXPEN) 100 UNIT/ML Pen Inject 10 Units into the skin daily at 10 pm. 08/07/18  Yes Kallie Locks, FNP  losartan (COZAAR) 100 MG tablet Take 1 tablet (100 mg total) by mouth daily. 08/07/18  Yes Kallie Locks, FNP  metFORMIN (GLUCOPHAGE) 1000 MG tablet Take 1 tablet (1,000 mg total) by mouth 2 (two) times daily with a meal. 08/07/18  Yes Kallie Locks, FNP  omeprazole (PRILOSEC) 20 MG capsule Take 1 capsule (20 mg total) by mouth daily. 08/07/18  Yes Kallie Locks, FNP  pravastatin (PRAVACHOL) 40 MG tablet Take 1 tablet (40 mg total) by mouth daily. 08/23/18  Yes Doles-Johnson, Teah, NP  traZODone (DESYREL) 50 MG  tablet Take 1 tablet (50 mg total) by mouth at bedtime. 08/07/18  Yes Kallie Locks, FNP  aspirin EC 81 MG tablet Take 1 tablet (81 mg total) by mouth daily. Patient not taking: Reported on 07/26/2022 08/30/18   Doles-Johnson, Teah, NP  cyclobenzaprine (FLEXERIL) 5 MG tablet Take 1 tablet (5 mg total) by mouth every 8 (eight) hours as needed for muscle  spasms. Patient not taking: Reported on 07/26/2022 08/07/18   Kallie Locks, FNP  ketorolac (TORADOL) 10 MG tablet Take 1 tablet (10 mg total) by mouth every 8 (eight) hours. Patient not taking: Reported on 07/26/2022 08/07/18   Kallie Locks, FNP  traMADol (ULTRAM) 50 MG tablet Take 1 tablet (50 mg total) by mouth every 6 (six) hours as needed for moderate pain. Patient not taking: Reported on 07/26/2022 09/07/15   Lissa Hoard, PA-C    Physical Exam: Vitals:   07/26/22 2130 07/26/22 2200 07/26/22 2300 07/26/22 2330  BP: (!) 159/81 125/60 135/65 133/71  Pulse: 80 79 87 86  Resp: 16 16 (!) 22 17  Temp: 98.3 F (36.8 C)     TempSrc: Oral     SpO2: 99% 98% 100% 97%  Weight:      Height:       Constitutional: Resting in bed, NAD, calm, comfortable Eyes: EOMI, lids and conjunctivae normal ENMT: Mucous membranes are moist. Posterior pharynx clear of any exudate or lesions.Normal dentition.  Neck: normal, supple, no masses. Respiratory: clear to auscultation bilaterally, no wheezing, no crackles. Normal respiratory effort. No accessory muscle use.  Cardiovascular: Regular rate and rhythm, no murmurs / rubs / gallops. No extremity edema. 2+ pedal pulses. Abdomen: no tenderness, no masses palpated.  Musculoskeletal: no clubbing / cyanosis. No joint deformity upper and lower extremities. Good ROM, no contractures. Normal muscle tone.  Skin: no rashes, lesions, ulcers. No induration Neurologic:Sensation intact. Strength 5/5 in all 4.  Psychiatric: Alert and oriented x 3.  EKG: Personally reviewed. Sinus rhythm, rate 89, no acute ischemic changes  Assessment/Plan Principal Problem:   Hyperglycemia due to type 2 diabetes mellitus (HCC) Active Problems:   UTI (urinary tract infection)   AKI (acute kidney injury) (HCC)   Hypertension associated with diabetes (HCC)   Thrombocytopenia (HCC)   Hyperlipidemia associated with type 2 diabetes mellitus (HCC)   Brianna Church  is a Spanish-speaking 77 y.o. female with medical history significant for insulin-dependent T2DM, HTN, HLD, GERD who is admitted with hyperglycemic crisis in insulin-dependent T2DM associated with UTI and AKI.  Assessment and Plan: * Hyperglycemia due to type 2 diabetes mellitus (HCC) Serum glucose 753 on admission without evidence of DKA.  Worsened in the setting of UTI and question of dietary and medication nonadherence. -Continue insulin infusion per protocol -Continue IV fluids as ordered and transition to D5-LR when CBG <250 -Check A1c -Consult to diabetes coordinator -Holding home metformin and glipizide  UTI (urinary tract infection) Symptomatic with dysuria.  Continue IV ceftriaxone.  Follow urine culture.  AKI (acute kidney injury) (HCC) Mild with creatinine 1.67 in setting of severe hyperglycemia and UTI.  Continue IV fluids and repeat labs in AM.  Hypertension associated with diabetes (HCC) Continue amlodipine and hydralazine.  Hold losartan with AKI.  Hyperlipidemia associated with type 2 diabetes mellitus (HCC) Continue pravastatin.  Thrombocytopenia (HCC) Mild without obvious bleeding.  Continue to monitor.  DVT prophylaxis: SCDs Start: 07/27/22 0044 Code Status: Full code Family Communication: Daughter and son at bedside Disposition Plan: From home and likely  discharge to home pending clinical progress Consults called: None Severity of Illness: The appropriate patient status for this patient is OBSERVATION. Observation status is judged to be reasonable and necessary in order to provide the required intensity of service to ensure the patient's safety. The patient's presenting symptoms, physical exam findings, and initial radiographic and laboratory data in the context of their medical condition is felt to place them at decreased risk for further clinical deterioration. Furthermore, it is anticipated that the patient will be medically stable for discharge from the  hospital within 2 midnights of admission.   Darreld Mclean MD [facility]  If 7PM-7AM, please contact night-coverage [email or web address]  07/27/2022, 12:51 AM

## 2022-07-27 NOTE — Progress Notes (Signed)
Pt arrived on floor via ED stretcher. Mobile interpreter at bedside. Pt transferred herself to ICU bed and placed on all monitors. CHG bath completed and gown applied. VSS. FSBS obtained. New IV obtainted. Pt tolerated well. Pt educated on importance of using call bell by interpreter. Bed placed in low,locked position, SR up x4. Call light within reach and bed alarm on. Pt verbalized good understanding on remaining in bed per interpreter.

## 2022-07-27 NOTE — Assessment & Plan Note (Signed)
COVID test negative.

## 2022-07-27 NOTE — ED Notes (Signed)
Pt ok'd by Dr. Allena Katz to have water.

## 2022-07-28 ENCOUNTER — Telehealth: Payer: Self-pay | Admitting: Emergency Medicine

## 2022-07-28 ENCOUNTER — Other Ambulatory Visit: Payer: Self-pay

## 2022-07-28 DIAGNOSIS — R519 Headache, unspecified: Secondary | ICD-10-CM

## 2022-07-28 LAB — GLUCOSE, CAPILLARY
Glucose-Capillary: 130 mg/dL — ABNORMAL HIGH (ref 70–99)
Glucose-Capillary: 158 mg/dL — ABNORMAL HIGH (ref 70–99)
Glucose-Capillary: 422 mg/dL — ABNORMAL HIGH (ref 70–99)

## 2022-07-28 LAB — BASIC METABOLIC PANEL
Anion gap: 5 (ref 5–15)
BUN: 11 mg/dL (ref 8–23)
CO2: 25 mmol/L (ref 22–32)
Calcium: 8.3 mg/dL — ABNORMAL LOW (ref 8.9–10.3)
Chloride: 109 mmol/L (ref 98–111)
Creatinine, Ser: 1.18 mg/dL — ABNORMAL HIGH (ref 0.44–1.00)
GFR, Estimated: 48 mL/min — ABNORMAL LOW (ref >60)
Glucose, Bld: 137 mg/dL — ABNORMAL HIGH (ref 70–99)
Potassium: 4.5 mmol/L (ref 3.5–5.1)
Sodium: 139 mmol/L (ref 135–145)

## 2022-07-28 LAB — MAGNESIUM: Magnesium: 2.2 mg/dL (ref 1.7–2.4)

## 2022-07-28 LAB — PHOSPHORUS: Phosphorus: 2.9 mg/dL (ref 2.5–4.6)

## 2022-07-28 MED ORDER — CEPHALEXIN 500 MG PO CAPS
500.0000 mg | ORAL_CAPSULE | Freq: Three times a day (TID) | ORAL | 0 refills | Status: AC
  Filled 2022-07-28: qty 9, 3d supply, fill #0

## 2022-07-28 MED ORDER — INSULIN ASPART 100 UNIT/ML IJ SOLN
0.0000 [IU] | Freq: Three times a day (TID) | INTRAMUSCULAR | Status: DC
  Administered 2022-07-28: 9 [IU] via SUBCUTANEOUS
  Filled 2022-07-28: qty 1

## 2022-07-28 MED ORDER — POLYETHYLENE GLYCOL 3350 17 G PO PACK
17.0000 g | PACK | Freq: Every day | ORAL | 0 refills | Status: DC | PRN
  Filled 2022-07-28: qty 14, 14d supply, fill #0

## 2022-07-28 MED ORDER — ENSURE MAX PROTEIN PO LIQD
11.0000 [oz_av] | Freq: Two times a day (BID) | ORAL | Status: DC
  Administered 2022-07-28: 11 [oz_av] via ORAL

## 2022-07-28 MED ORDER — ADULT MULTIVITAMIN W/MINERALS CH: 1.0000 | ORAL_TABLET | Freq: Every day | ORAL | Status: DC

## 2022-07-28 MED ORDER — INSULIN LISPRO PROT & LISPRO (75-25 MIX) 100 UNIT/ML KWIKPEN
8.0000 [IU] | PEN_INJECTOR | Freq: Two times a day (BID) | SUBCUTANEOUS | 0 refills | Status: DC
  Filled 2022-07-28: qty 15, 90d supply, fill #0

## 2022-07-28 MED ORDER — INSULIN ASPART 100 UNIT/ML IJ SOLN: 0.0000 [IU] | Freq: Every day | INTRAMUSCULAR | Status: DC

## 2022-07-28 MED ORDER — INSULIN PEN NEEDLE 32G X 4 MM MISC
1.0000 | Freq: Two times a day (BID) | 0 refills | Status: DC
  Filled 2022-07-28: qty 100, 50d supply, fill #0

## 2022-07-28 NOTE — Telephone Encounter (Signed)
Received call from [facility] ICU requesting an appointment for patient in 2 weeks. Advised patient would need to complete an application with supporting documentation. Patient was last seen at [facility] in 2019. Advised patient can pick up an application at [facility] or can get one on our website.

## 2022-07-28 NOTE — Progress Notes (Signed)
Note plans for discharge today.  Called and spoke with patient's Grandson regarding new insulin regimen.  Stressed with him that the insulin will need to be given with breakfast and supper (patient must eat) and that she needs to check her blood sugars at least 3 times a day.  He states that his wife is patients granddaughter and he will also share with her.  They plan to pick patient up at 4 pm.   Stressed importance of patient taking medicine as prescribed.  He verbalized understanding and state that he and his wife will be here at 4 pm to pick up patient.   Thanks,  Beryl Meager, RN, BC-ADM Inpatient Diabetes Coordinator Pager [phone]  (8a-5p)

## 2022-07-28 NOTE — Discharge Summary (Signed)
Physician Discharge Summary   Patient: Brianna Church MRN: [id number] DOB: Jul 08, 1946  Admit date:     07/26/2022  Discharge date: 07/28/22  Discharge Physician: Alford Highland   [facility]  Recommendations at discharge:   [facility] Refer to ophthalmology as outpatient  Discharge Diagnoses: Principal Problem:   Hyperglycemia due to type 2 diabetes mellitus (HCC) Active Problems:   UTI (urinary tract infection)   AKI (acute kidney injury) (HCC)   URTI (acute upper respiratory infection)   Hypertension associated with diabetes (HCC)   Thrombocytopenia (HCC)   Hyperlipidemia associated with type 2 diabetes mellitus (HCC)   Hyponatremia   Headache   Uncontrolled diabetes mellitus with hyperglycemia [facility])    Hospital Course: Brianna Church is a Spanish-speaking 76 y.o. female with medical history significant for insulin-dependent T2DM, HTN, HLD, GERD who is admitted with hyperglycemic crisis in insulin-dependent T2DM associated with UTI and AKI.  The patient was initially on insulin drip and then converted over to Levemir twice a day 8 units.  Patient diabetes coordinator recommended changing over to 75/25 insulin 8 units twice a day upon discharge.  Hemoglobin A1c is very elevated 12.8.  She was started on IV Rocephin for urinary tract infection.  Assessment and Plan: * Hyperglycemia due to type 2 diabetes mellitus (HCC) Serum glucose 753 on admission without evidence of DKA.  Worsened in the setting of UTI and question of dietary and medication nonadherence. -The patient was converted off insulin drip to Levemir 8 units twice a day. -The diabetes coordinator recommended 75/25 insulin 8 units twice a day upon discharge which was prescribed into medication management. -Hemoglobin A1c elevated at 12.8.  UTI (urinary tract infection) Patient was given IV Rocephin here in the hospital.  E. coli growing out of the cultures.  Keflex  upon discharge  URTI (acute upper respiratory infection) COVID test negative  AKI (acute kidney injury) (HCC) Acute kidney injury on chronic kidney disease stage IIIa.  Creatinine 1.67 on presentation down to 1.18 upon discharge with a GFR 48.  Hypertension associated with diabetes (HCC) Continue hydralazine.  Hold losartan with AKI.  Hold Norvasc.  Headache IV magnesium given yesterday.  Hyponatremia Secondary to uncontrolled diabetes and high sugar.  Last sodium 139.  Hyperlipidemia associated with type 2 diabetes mellitus (HCC) Continue pravastatin.  Thrombocytopenia (HCC) Platelet count 85.  Hepatitis C negative.  Follow-up as outpatient in trend.  Looking back in 2019 did have a platelet count of 126 at that time.         Consultants: Diabetes coordinator Procedures performed: None Disposition: Home Diet recommendation:  Cardiac and Carb modified diet DISCHARGE MEDICATION: Allergies as of 07/28/2022   No Known Allergies      Medication List     STOP taking these medications    amLODipine 10 MG tablet Commonly known as: NORVASC   glipiZIDE 10 MG tablet Commonly known as: GLUCOTROL   insulin detemir 100 UNIT/ML FlexPen Commonly known as: Levemir FlexPen   losartan 100 MG tablet Commonly known as: COZAAR   metFORMIN 1000 MG tablet Commonly known as: GLUCOPHAGE       TAKE these medications    cephALEXin 500 MG capsule Commonly known as: KEFLEX Take 1 capsule (500 mg total) by mouth 3 (three) times daily for 3 days.   hydrALAZINE 25 MG tablet Commonly known as: APRESOLINE Take 1 tablet (25 mg total) by mouth 3 (three) times daily.   Insulin Lispro Prot & Lispro (75-25) 100  UNIT/ML Kwikpen Commonly known as: HumaLOG Mix 75/25 KwikPen Inject 8 Units into the skin 2 (two) times daily.   Insulin Pen Needle 32G X 4 MM Misc Use 2 times a day (1 Dose by Does not apply route 2 (two) times daily.)   omeprazole 20 MG capsule Commonly known as:  PRILOSEC Take 1 capsule (20 mg total) by mouth daily.   polyethylene glycol 17 g packet Commonly known as: MIRALAX / GLYCOLAX Take 17 g by mouth daily as needed.   pravastatin 40 MG tablet Commonly known as: Pravachol Take 1 tablet (40 mg total) by mouth daily.   traZODone 50 MG tablet Commonly known as: DESYREL Take 1 tablet (50 mg total) by mouth at bedtime.        Follow-up Information     [facility] Follow up on 07/28/2022.   Specialty: Primary Care Why: PT MUST GO [facility] FILL OUT APPLICATION BEFORE BEEN SEEN WITH DOCUMENTS Contact information: [address] [place] [address] [phone]        Nevada Crane, MD Follow up on 09/08/2022.   Specialty: Ophthalmology Why: diabetic with blurred vision likely from uncontrolled diabetes. APPT: Sep 08, 2022 AT 2PM DR [facility] OFFICE WILL SEND PT PAPERS TO FILL OUT Contact information: [address] [place] [address] [phone]                Discharge Exam: Ceasar Mons Weights   07/26/22 2128 07/28/22 0500  Weight: 60 kg 59.2 kg   Physical Exam HENT:     Head: Normocephalic.     Mouth/Throat:     Pharynx: No oropharyngeal exudate.  Eyes:     General: Lids are normal.     Conjunctiva/sclera: Conjunctivae normal.  Cardiovascular:     Rate and Rhythm: Normal rate and regular rhythm.     Heart sounds: Normal heart sounds, S1 normal and S2 normal.  Pulmonary:     Breath sounds: No decreased breath sounds, wheezing, rhonchi or rales.  Abdominal:     Palpations: Abdomen is soft.     Tenderness: There is no abdominal tenderness.  Musculoskeletal:     Right lower leg: No swelling.     Left lower leg: No swelling.  Skin:    General: Skin is warm.     Findings: No rash.  Neurological:     Mental Status: She is alert and oriented to person, place, and time.      Condition at discharge: stable  The results of significant  diagnostics from this hospitalization (including imaging, microbiology, ancillary and laboratory) are listed below for reference.   Imaging Studies: No results found.  Microbiology: Results for orders placed or performed during the hospital encounter of 07/26/22  Urine Culture     Status: Abnormal (Preliminary result)   Collection Time: 07/26/22 10:50 PM   Specimen: Urine, Random  Result Value Ref Range Status   Specimen Description   Final    URINE, RANDOM Performed at [facility], [address], [place], [place] [address]    Special Requests   Final    NONE Performed at [facility], [address], [place], [place] [address]    Culture (A)  Final    >=100,000 COLONIES/mL ESCHERICHIA COLI SUSCEPTIBILITIES TO FOLLOW Performed at [facility], [address] [place], [place] [address]    Report Status PENDING  Incomplete  MRSA Next Gen by PCR, Nasal  Status: None   Collection Time: 07/27/22 12:53 AM   Specimen: Nasal Mucosa; Nasal Swab  Result Value Ref Range Status   MRSA by PCR Next Gen NOT DETECTED NOT DETECTED Final    Comment: (NOTE) The GeneXpert MRSA Assay (FDA approved for NASAL specimens only), is one component of a comprehensive MRSA colonization surveillance program. It is not intended to diagnose MRSA infection nor to guide or monitor treatment for MRSA infections. Test performance is not FDA approved in patients less than 44 years old. Performed at [facility], [address] [place], [place] [address]   SARS Coronavirus 2 by RT PCR (hospital order, performed in [facility] hospital lab) *cepheid single result test* Anterior Nasal Swab     Status: None   Collection Time: 07/27/22 12:19 PM   Specimen: Anterior Nasal Swab  Result Value Ref Range Status   SARS Coronavirus 2 by RT PCR NEGATIVE NEGATIVE Final    Comment: (NOTE) SARS-CoV-2 target nucleic acids are NOT DETECTED.  The SARS-CoV-2 RNA is generally  detectable in upper and lower respiratory specimens during the acute phase of infection. The lowest concentration of SARS-CoV-2 viral copies this assay can detect is 250 copies / mL. A negative result does not preclude SARS-CoV-2 infection and should not be used as the sole basis for treatment or other patient management decisions.  A negative result may occur with improper specimen collection / handling, submission of specimen other than nasopharyngeal swab, presence of viral mutation(s) within the areas targeted by this assay, and inadequate number of viral copies (<250 copies / mL). A negative result must be combined with clinical observations, patient history, and epidemiological information.  Fact Sheet for Patients:   RoadLapTop.co.za  Fact Sheet for Healthcare Providers: http://kim-miller.com/  This test is not yet approved or  cleared by the [place] FDA and has been authorized for detection and/or diagnosis of SARS-CoV-2 by FDA under an Emergency Use Authorization (EUA).  This EUA will remain in effect (meaning this test can be used) for the duration of the COVID-19 declaration under Section 564(b)(1) of the Act, 21 U.S.C. section 360bbb-3(b)(1), unless the authorization is terminated or revoked sooner.  Performed at [facility], [address] [place], [place] [address]     Labs: CBC: Recent Labs  Lab 07/26/22 2131 07/27/22 0452  WBC 6.3 5.4  HGB 12.7 11.6*  HCT 38.7 33.6*  MCV 85.1 82.6  PLT 92* 85*   Basic Metabolic Panel: Recent Labs  Lab 07/26/22 2131 07/27/22 0452 07/27/22 1237 07/27/22 1730 07/28/22 0456  NA 125* 134* 134*  --  139  K 4.5 3.7 3.6 4.2 4.5  CL 91* 104 103  --  109  CO2 22 23 24   --  25  GLUCOSE 753* 263* 200*  --  137*  BUN 28* 20 15  --  11  CREATININE 1.67* 1.30* 1.14*  --  1.18*  CALCIUM 9.0 8.4* 8.1*  --  8.3*  MG  --   --   --   --  2.2  PHOS  --   --   --   --   2.9   Liver Function Tests: Recent Labs  Lab 07/26/22 2131  AST 31  ALT 21  ALKPHOS 118  BILITOT 1.0  PROT 8.0  ALBUMIN 4.0   CBG: Recent Labs  Lab 07/27/22 2113 07/27/22 2329 07/28/22 0341 07/28/22 0726 07/28/22 1128  GLUCAP 240* 140* 130* 158* 422*    Discharge time spent: greater than  30 minutes.  Signed: Loletha Grayer, MD [facility] 07/28/2022

## 2022-07-28 NOTE — Progress Notes (Addendum)
Inpatient Diabetes Program Recommendations  AACE/ADA: New Consensus Statement on Inpatient Glycemic Control (2015)  Target Ranges:  Prepandial:   less than 140 mg/dL      Peak postprandial:   less than 180 mg/dL (1-2 hours)      Critically ill patients:  140 - 180 mg/dL   Lab Results  Component Value Date   GLUCAP 158 (H) 07/28/2022   HGBA1C 12.8 (H) 07/27/2022    Review of Glycemic Control  Latest Reference Range & Units 07/27/22 23:29 07/28/22 03:41 07/28/22 07:26  Glucose-Capillary 70 - 99 mg/dL 201 (H) 007 (H) 121 (H)  Diabetes history: DM 2 Outpatient Diabetes medications:  Glucotrol 10 mg daily, Levemir 10 units q HS, Metformin 1000 mg bid Current orders for Inpatient glycemic control:  Levemir 8 units bid Novolog 0-9 units tid with meals and HS  Inpatient Diabetes Program Recommendations:    Note plans for discharge today.  Patient is able to get medications from [facility] outpatient pharmacy (per medication management clinic).  According to pharmacy, they have Humalog 75/25 pens which are available through the [facility]  Consider discharge on  Humalog 75/25 8 units bid with meals (must take with food).    Thanks,  Beryl Meager, RN, BC-ADM Inpatient Diabetes Coordinator Pager [phone]  (8a-5p)

## 2022-07-28 NOTE — TOC Transition Note (Signed)
Transition of Care [facility]) - CM/SW Discharge Note   Patient Details  Name: Brianna Church MRN: [id number] Date of Birth: 12-Sep-1946  [facility] CM/SW Contact:  Allayne Butcher, RN Phone Number: 07/28/2022, 11:01 AM   Clinical Narrative:    Patient is medically cleared for discharge home with family.  Glucometer delivered to the patient's room yesterday.  Patient also provided with [facility]. Prescriptions have been sent to [facility] OP pharmacy.  Patient will need to pick up at discharge as she or her family will need to sign a form at the pharmacy.    Final next level of care: Home/Self Care Barriers to Discharge: Barriers Resolved   Patient Goals and CMS Choice Patient states their goals for this hospitalization and ongoing recovery are:: patient feeling better will be going to her daughters until med Oct      Discharge Placement                       Discharge Plan and Services In-house Referral: Nutrition, Interpreting Services Discharge Planning Services: CM Consult            DME Arranged: N/A DME Agency: NA       HH Arranged: NA HH Agency: NA        Social Determinants of Health (SDOH) Interventions     Readmission Risk Interventions     No data to display

## 2022-07-29 LAB — URINE CULTURE: Culture: >=100000 — AB

## 2022-08-16 ENCOUNTER — Other Ambulatory Visit: Payer: Self-pay

## 2022-08-16 DIAGNOSIS — I10 Essential (primary) hypertension: Secondary | ICD-10-CM | POA: Insufficient documentation

## 2022-08-16 DIAGNOSIS — Z5321 Procedure and treatment not carried out due to patient leaving prior to being seen by health care provider: Secondary | ICD-10-CM | POA: Insufficient documentation

## 2022-08-16 DIAGNOSIS — M549 Dorsalgia, unspecified: Secondary | ICD-10-CM | POA: Insufficient documentation

## 2022-08-16 DIAGNOSIS — L299 Pruritus, unspecified: Secondary | ICD-10-CM | POA: Insufficient documentation

## 2022-08-16 NOTE — ED Notes (Signed)
Notified Dr. Ellender Hose of pt's blood pressure in triage. Will continue to monitor.

## 2022-08-16 NOTE — ED Triage Notes (Addendum)
Use of interpreter during triage. Pt via POV with family c/o diffuse itching on her back with intermittent back pain. 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 Now when she lays down, she feels like everything itches and like her back is "about to break in two." She does not have pain unless she is lying down. She had a mammogram in [place] but did not receive results and would like the test to be repeated. She says that her back itches a lot and she has been scratching her back with a stick. She feels like she has bugs in her skin on her back. HTN noted in triage; pt states she always gets anxious when she is around doctors but that her BP is usually normal. Asymptomatic.

## 2022-08-17 ENCOUNTER — Emergency Department: Payer: Self-pay

## 2022-08-17 ENCOUNTER — Other Ambulatory Visit: Payer: Self-pay

## 2022-08-17 ENCOUNTER — Observation Stay
Admission: EM | Admit: 2022-08-17 | Discharge: 2022-08-18 | Disposition: A | Discharge status: Home / Self Care | Payer: Self-pay | Attending: Osteopathic Medicine | Admitting: Osteopathic Medicine

## 2022-08-17 ENCOUNTER — Encounter: Payer: Self-pay | Admitting: Intensive Care

## 2022-08-17 ENCOUNTER — Emergency Department
Admission: EM | Admit: 2022-08-17 | Discharge: 2022-08-17 | Discharge status: Against Medical Advice | Payer: Self-pay | Attending: Emergency Medicine | Admitting: Emergency Medicine

## 2022-08-17 DIAGNOSIS — Z79899 Other long term (current) drug therapy: Secondary | ICD-10-CM | POA: Insufficient documentation

## 2022-08-17 DIAGNOSIS — E785 Hyperlipidemia, unspecified: Secondary | ICD-10-CM | POA: Insufficient documentation

## 2022-08-17 DIAGNOSIS — R739 Hyperglycemia, unspecified: Secondary | ICD-10-CM

## 2022-08-17 DIAGNOSIS — G4700 Insomnia, unspecified: Secondary | ICD-10-CM

## 2022-08-17 DIAGNOSIS — Z794 Long term (current) use of insulin: Secondary | ICD-10-CM | POA: Insufficient documentation

## 2022-08-17 DIAGNOSIS — E871 Hypo-osmolality and hyponatremia: Secondary | ICD-10-CM | POA: Insufficient documentation

## 2022-08-17 DIAGNOSIS — E1169 Type 2 diabetes mellitus with other specified complication: Secondary | ICD-10-CM | POA: Insufficient documentation

## 2022-08-17 DIAGNOSIS — I10 Essential (primary) hypertension: Secondary | ICD-10-CM | POA: Insufficient documentation

## 2022-08-17 DIAGNOSIS — N179 Acute kidney failure, unspecified: Secondary | ICD-10-CM | POA: Insufficient documentation

## 2022-08-17 DIAGNOSIS — K219 Gastro-esophageal reflux disease without esophagitis: Secondary | ICD-10-CM | POA: Diagnosis present

## 2022-08-17 DIAGNOSIS — I152 Hypertension secondary to endocrine disorders: Secondary | ICD-10-CM | POA: Diagnosis present

## 2022-08-17 DIAGNOSIS — E1165 Type 2 diabetes mellitus with hyperglycemia: Principal | ICD-10-CM | POA: Insufficient documentation

## 2022-08-17 LAB — CBG MONITORING, ED
Glucose-Capillary: 534 mg/dL (ref 70–99)
Glucose-Capillary: 312 mg/dL — ABNORMAL HIGH (ref 70–99)

## 2022-08-17 LAB — CBC WITH DIFFERENTIAL/PLATELET
Abs Immature Granulocytes: 0.01 10*3/uL (ref 0.00–0.07)
Basophils Absolute: 0 10*3/uL (ref 0.0–0.1)
Basophils Relative: 1 %
Eosinophils Absolute: 0.1 10*3/uL (ref 0.0–0.5)
Eosinophils Relative: 2 %
HCT: 37.5 % (ref 36.0–46.0)
Hemoglobin: 11.5 g/dL — ABNORMAL LOW (ref 12.0–15.0)
Immature Granulocytes: 0 %
Lymphocytes Relative: 24 %
Lymphs Abs: 0.7 10*3/uL (ref 0.7–4.0)
MCH: 27.3 pg (ref 26.0–34.0)
MCHC: 30.7 g/dL (ref 30.0–36.0)
MCV: 88.9 fL (ref 80.0–100.0)
Monocytes Absolute: 0.3 10*3/uL (ref 0.1–1.0)
Monocytes Relative: 10 %
Neutro Abs: 2 10*3/uL (ref 1.7–7.7)
Neutrophils Relative %: 63 %
Platelets: 85 10*3/uL — ABNORMAL LOW (ref 150–400)
RBC: 4.22 MIL/uL (ref 3.87–5.11)
RDW: 13.4 % (ref 11.5–15.5)
WBC: 3.2 10*3/uL — ABNORMAL LOW (ref 4.0–10.5)
nRBC: 0 % (ref 0.0–0.2)

## 2022-08-17 LAB — COMPREHENSIVE METABOLIC PANEL
ALT: 17 U/L (ref 0–44)
AST: 35 U/L (ref 15–41)
Albumin: 3.7 g/dL (ref 3.5–5.0)
Alkaline Phosphatase: 102 U/L (ref 38–126)
Anion gap: 10 (ref 5–15)
BUN: 15 mg/dL (ref 8–23)
CO2: 23 mmol/L (ref 22–32)
Calcium: 8.3 mg/dL — ABNORMAL LOW (ref 8.9–10.3)
Chloride: 97 mmol/L — ABNORMAL LOW (ref 98–111)
Creatinine, Ser: 1.43 mg/dL — ABNORMAL HIGH (ref 0.44–1.00)
GFR, Estimated: 38 mL/min — ABNORMAL LOW (ref >60)
Glucose, Bld: 684 mg/dL (ref 70–99)
Potassium: 4.6 mmol/L (ref 3.5–5.1)
Sodium: 130 mmol/L — ABNORMAL LOW (ref 135–145)
Total Bilirubin: 0.9 mg/dL (ref 0.3–1.2)
Total Protein: 7.5 g/dL (ref 6.5–8.1)

## 2022-08-17 LAB — TROPONIN I (HIGH SENSITIVITY)
Troponin I (High Sensitivity): 20 ng/L — ABNORMAL HIGH (ref <18)
Troponin I (High Sensitivity): 23 ng/L — ABNORMAL HIGH (ref <18)

## 2022-08-17 MED ORDER — SODIUM CHLORIDE 0.9 % IV BOLUS
1000.0000 mL | Freq: Once | INTRAVENOUS | Status: AC
  Administered 2022-08-17: 1000 mL via INTRAVENOUS

## 2022-08-17 MED ORDER — INSULIN ASPART 100 UNIT/ML IJ SOLN: 5.0000 [IU] | Freq: Once | INTRAMUSCULAR | Status: DC

## 2022-08-17 MED ORDER — IOHEXOL 350 MG/ML SOLN
60.0000 mL | Freq: Once | INTRAVENOUS | Status: AC | PRN
  Administered 2022-08-17: 60 mL via INTRAVENOUS

## 2022-08-17 MED ORDER — INSULIN ASPART 100 UNIT/ML IJ SOLN
10.0000 [IU] | Freq: Once | INTRAMUSCULAR | Status: AC
  Administered 2022-08-17: 10 [IU] via SUBCUTANEOUS
  Filled 2022-08-17: qty 1

## 2022-08-17 NOTE — ED Notes (Addendum)
Patient transported to CT 

## 2022-08-17 NOTE — ED Triage Notes (Signed)
Patient spanish speaking. C/o upper/mid back pain that has been ongoing X2 years but worsened the last few days. Denies recent injury

## 2022-08-17 NOTE — ED Notes (Signed)
No answer when called several times from lobby 

## 2022-08-17 NOTE — H&P (Signed)
History and Physical    Patient: Brianna Church TOI:[id number] DOB: 1946-01-13 DOA: 08/17/2022 DOS: the patient was seen and examined on 08/17/2022 PCP: Pcp, No  Patient coming from: Home  Chief Complaint:  Chief Complaint  Patient presents with   Back Pain   HPI: Brianna Church is a 76 y.o. female with medical history significant of insulin-dependent diabetes, GERD, essential hypertension, chronic low back pain who is on Humalog 75/25 and has been apparently taking her medications as prescribed but came in with markedly elevated blood sugar over 700.  Patient also complained of upper back pain that has been going on and off for the past 2 years but has gotten slightly worse.  No fever or chills no nausea vomiting or diarrhea.  Patient was being given IV fluids and initial IV insulin.  Blood sugar has improved slightly and plan is to admit the patient for observation and adjust medications then transition to the subcutaneous insulin.  Review of Systems: As mentioned in the history of present illness. All other systems reviewed and are negative. Past Medical History:  Diagnosis Date   Diabetes mellitus without complication (HCC)    GERD (gastroesophageal reflux disease)    Hypertension    Past Surgical History:  Procedure Laterality Date   ABDOMINAL HYSTERECTOMY     CHOLECYSTECTOMY     Social History:  reports that she has never smoked. She has never used smokeless tobacco. She reports that she does not drink alcohol and does not use drugs.  No Known Allergies  History reviewed. No pertinent family history.  Prior to Admission medications   Medication Sig Start Date End Date Taking? Authorizing Provider  hydrALAZINE (APRESOLINE) 25 MG tablet Take 1 tablet (25 mg total) by mouth 3 (three) times daily. 08/07/18   Azzie Glatter, FNP  Insulin Lispro Prot & Lispro (HUMALOG MIX 75/25 KWIKPEN) (75-25) 100 UNIT/ML Kwikpen Inject 8 Units into the skin 2 (two) times daily. 07/28/22    Wieting, Richard, MD  Insulin Pen Needle 32G X 4 MM MISC 1 Dose by Does not apply route 2 (two) times daily. 07/28/22   Loletha Grayer, MD  omeprazole (PRILOSEC) 20 MG capsule Take 1 capsule (20 mg total) by mouth daily. 08/07/18   Azzie Glatter, FNP  polyethylene glycol (MIRALAX / GLYCOLAX) 17 g packet Take 17 g by mouth daily as needed. 07/28/22   Loletha Grayer, MD  pravastatin (PRAVACHOL) 40 MG tablet Take 1 tablet (40 mg total) by mouth daily. 08/23/18   Doles-Johnson, Teah, NP  traZODone (DESYREL) 50 MG tablet Take 1 tablet (50 mg total) by mouth at bedtime. 08/07/18   Azzie Glatter, FNP    Physical Exam: Vitals:   08/17/22 1847 08/17/22 1852 08/17/22 2246  BP:  (!) 172/84 (!) 192/83  Pulse:  77 71  Resp:  16 18  Temp:  98.4 F (36.9 C) 97.9 F (36.6 C)  TempSrc:  Oral Oral  SpO2:  96% 96%  Weight: 59 kg    Height: 5\' 1"  (1.549 m)     Constitutional: NAD, calm, comfortable Eyes: PERRL, lids and conjunctivae normal ENMT: Mucous membranes are moist. Posterior pharynx clear of any exudate or lesions.Normal dentition.  Neck: normal, supple, no masses, no thyromegaly Respiratory: clear to auscultation bilaterally, no wheezing, no crackles. Normal respiratory effort. No accessory muscle use.  Cardiovascular: Regular rate and rhythm, no murmurs / rubs / gallops. No extremity edema. 2+ pedal pulses. No carotid bruits.  Abdomen: no tenderness, no masses palpated. No  hepatosplenomegaly. Bowel sounds positive.  Musculoskeletal: Good range of motion, no joint swelling or tenderness, Skin: no rashes, lesions, ulcers. No induration Neurologic: CN 2-12 grossly intact. Sensation intact, DTR normal. Strength 5/5 in all 4.  Psychiatric: Normal judgment and insight. Alert and oriented x 3. Normal mood  Data Reviewed:  Sodium 130, blood sugar 684, creatinine 1.43, white count 3.2, hemoglobin 11.5, CT angiogram of the chest showed no acute findings.  Subsequent blood sugar is down to  534.  Assessment and Plan:  #1 hyperglycemia: No DKA or honk.  Slight response to IV fluids and insulin.  Probably not responding to her current dose of insulin.  Patient will be admitted and initiated on sliding scale insulin with Lantus.  Did achieve 7525 dose could be adjusted at home or a new regimen will need to be set up.  Diabetic education will be initiated.  #2 essential hypertension: Blood pressure appears elevated.  Monitor and start medications as needed.  #3 pseudohyponatremia: May likely correct with correction in blood sugar  #4 GERD: We will benefit from the PPIs.    Advance Care Planning:   Code Status: Prior full  Consults: None  Family Communication: Daughter at bedside  Severity of Illness: The appropriate patient status for this patient is OBSERVATION. Observation status is judged to be reasonable and necessary in order to provide the required intensity of service to ensure the patient's safety. The patient's presenting symptoms, physical exam findings, and initial radiographic and laboratory data in the context of their medical condition is felt to place them at decreased risk for further clinical deterioration. Furthermore, it is anticipated that the patient will be medically stable for discharge from the hospital within 2 midnights of admission.   AuthorLonia Blood, MD 08/17/2022 11:09 PM  For on call review [email or web address].

## 2022-08-17 NOTE — ED Provider Notes (Signed)
[facility] Provider Note  Patient Contact: 8:00 PM (approximate)   History   Back Pain   HPI  Brianna Church is a 76 y.o. female with a history of hypertension, diabetes, GERD and pancreatic cancer, presents to the emergency department with upper back pain.  Patient reports that she has had upper back pain on and off for the past 2 years.  She states that she received chemo and radiation for her pancreatic cancer and is not currently receiving treatments.  She denies chest tightness or shortness of breath.  She denies numbness or tingling in the upper and lower extremities.  She denies epigastric abdominal pain, nausea, vomiting or recent weight loss.      Physical Exam   Triage Vital Signs: ED Triage Vitals  Enc Vitals Group     BP 08/17/22 1852 (!) 172/84     Pulse Rate 08/17/22 1852 77     Resp 08/17/22 1852 16     Temp 08/17/22 1852 98.4 F (36.9 C)     Temp Source 08/17/22 1852 Oral     SpO2 08/17/22 1852 96 %     Weight 08/17/22 1847 130 lb (59 kg)     Height 08/17/22 1847 5\' 1"  (1.549 m)     Head Circumference --      Peak Flow --      Pain Score 08/17/22 1847 9     Pain Loc --      Pain Edu? --      Excl. in GC? --     Most recent vital signs: Vitals:   08/17/22 1852 08/17/22 2246  BP: (!) 172/84 (!) 192/83  Pulse: 77 71  Resp: 16 18  Temp: 98.4 F (36.9 C) 97.9 F (36.6 C)  SpO2: 96% 96%     General: Alert and in no acute distress. Eyes:  PERRL. EOMI. Head: No acute traumatic findings ENT:      Nose: No congestion/rhinnorhea.      Mouth/Throat: Mucous membranes are moist. Neck: No stridor. No cervical spine tenderness to palpation. Cardiovascular:  Good peripheral perfusion Respiratory: Normal respiratory effort without tachypnea or retractions. Lungs CTAB. Good air entry to the bases with no decreased or absent breath sounds. Gastrointestinal: Bowel sounds 4 quadrants. Soft and nontender to palpation. No guarding  or rigidity. No palpable masses. No distention. No CVA tenderness. Musculoskeletal: Full range of motion to all extremities.  Neurologic:  No gross focal neurologic deficits are appreciated.  Skin:   No rash noted Other:   ED Results / Procedures / Treatments   Labs (all labs ordered are listed, but only abnormal results are displayed) Labs Reviewed  CBC WITH DIFFERENTIAL/PLATELET - Abnormal; Notable for the following components:      Result Value   WBC 3.2 (*)    Hemoglobin 11.5 (*)    Platelets 85 (*)    All other components within normal limits  COMPREHENSIVE METABOLIC PANEL - Abnormal; Notable for the following components:   Sodium 130 (*)    Chloride 97 (*)    Glucose, Bld 684 (*)    Creatinine, Ser 1.43 (*)    Calcium 8.3 (*)    GFR, Estimated 38 (*)    All other components within normal limits  CBG MONITORING, ED - Abnormal; Notable for the following components:   Glucose-Capillary 534 (*)    All other components within normal limits  CBG MONITORING, ED - Abnormal; Notable for the following components:   Glucose-Capillary 312 (*)  All other components within normal limits  TROPONIN I (HIGH SENSITIVITY) - Abnormal; Notable for the following components:   Troponin I (High Sensitivity) 20 (*)    All other components within normal limits  TROPONIN I (HIGH SENSITIVITY) - Abnormal; Notable for the following components:   Troponin I (High Sensitivity) 23 (*)    All other components within normal limits  URINALYSIS, ROUTINE W REFLEX MICROSCOPIC      PROCEDURES:  Critical Care performed: No  Procedures   MEDICATIONS ORDERED IN ED: Medications  sodium chloride 0.9 % bolus 1,000 mL (0 mLs Intravenous Stopped 08/17/22 2125)  insulin aspart (novoLOG) injection 10 Units (10 Units Subcutaneous Given 08/17/22 2103)  iohexol (OMNIPAQUE) 350 MG/ML injection 60 mL (60 mLs Intravenous Contrast Given 08/17/22 2042)     IMPRESSION / MDM / ASSESSMENT AND PLAN / ED COURSE  I  reviewed the triage vital signs and the nursing notes.                              Assessment and plan: Back pain:  Hyperglycemia:  76 year old female with history of type 2 diabetes, hypertension, hyperlipidemia and GERD currently on Levemir 8 units twice daily presents to the emergency department with upper back pain.  Patient was hypertensive at triage but vital signs are otherwise reassuring.  Patient was alert and nontoxic-appearing  Patient's glucose was notably elevated at 684 on CMP with slight elevation in patient's creatinine from her baseline..  Patient's troponin was elevated at 20 and 23 respectively.  CBC largely within reference range.  Patient was given 10 units of subcu insulin along with a normal saline bolus and her glucose trended down to 312.  The use of a Spanish translator was used during this emergency department encounter and I am concerned the patient has new elevated troponins with persistent hyperglycemia despite taking her Levemir as directed.  Will admit to the hospitalist service under the care of Dr. Jonelle Sidle for continued observation and trending of blood glucose levels.   FINAL CLINICAL IMPRESSION(S) / ED DIAGNOSES   Final diagnoses:  Hyperglycemia     Rx / DC Orders   ED Discharge Orders     None        Note:  This document was prepared using Dragon voice recognition software and may include unintentional dictation errors.   Vallarie Mare [place], PA-C 08/17/22 2310    Merlyn Lot, MD 08/18/22 [phone]

## 2022-08-18 ENCOUNTER — Other Ambulatory Visit: Payer: Self-pay

## 2022-08-18 DIAGNOSIS — I152 Hypertension secondary to endocrine disorders: Secondary | ICD-10-CM

## 2022-08-18 DIAGNOSIS — E1169 Type 2 diabetes mellitus with other specified complication: Secondary | ICD-10-CM

## 2022-08-18 DIAGNOSIS — E1159 Type 2 diabetes mellitus with other circulatory complications: Secondary | ICD-10-CM

## 2022-08-18 DIAGNOSIS — R739 Hyperglycemia, unspecified: Secondary | ICD-10-CM

## 2022-08-18 DIAGNOSIS — E785 Hyperlipidemia, unspecified: Secondary | ICD-10-CM

## 2022-08-18 LAB — URINALYSIS, ROUTINE W REFLEX MICROSCOPIC
Bilirubin Urine: NEGATIVE
Glucose, UA: >=500 mg/dL — AB
Hgb urine dipstick: NEGATIVE
Ketones, ur: NEGATIVE mg/dL
Leukocytes,Ua: NEGATIVE
Nitrite: NEGATIVE
Protein, ur: NEGATIVE mg/dL
Specific Gravity, Urine: 1.016 (ref 1.005–1.030)
pH: 7 (ref 5.0–8.0)

## 2022-08-18 LAB — CBC
HCT: 38.6 % (ref 36.0–46.0)
Hemoglobin: 12.1 g/dL (ref 12.0–15.0)
MCH: 27.3 pg (ref 26.0–34.0)
MCHC: 31.3 g/dL (ref 30.0–36.0)
MCV: 87.1 fL (ref 80.0–100.0)
Platelets: 110 10*3/uL — ABNORMAL LOW (ref 150–400)
RBC: 4.43 MIL/uL (ref 3.87–5.11)
RDW: 13.2 % (ref 11.5–15.5)
WBC: 3.8 10*3/uL — ABNORMAL LOW (ref 4.0–10.5)
nRBC: 0 % (ref 0.0–0.2)

## 2022-08-18 LAB — COMPREHENSIVE METABOLIC PANEL
ALT: 17 U/L (ref 0–44)
AST: 30 U/L (ref 15–41)
Albumin: 3.7 g/dL (ref 3.5–5.0)
Alkaline Phosphatase: 87 U/L (ref 38–126)
Anion gap: 8 (ref 5–15)
BUN: 11 mg/dL (ref 8–23)
CO2: 26 mmol/L (ref 22–32)
Calcium: 8.4 mg/dL — ABNORMAL LOW (ref 8.9–10.3)
Chloride: 103 mmol/L (ref 98–111)
Creatinine, Ser: 0.96 mg/dL (ref 0.44–1.00)
GFR, Estimated: >60 mL/min (ref >60)
Glucose, Bld: 189 mg/dL — ABNORMAL HIGH (ref 70–99)
Potassium: 3.6 mmol/L (ref 3.5–5.1)
Sodium: 137 mmol/L (ref 135–145)
Total Bilirubin: 1 mg/dL (ref 0.3–1.2)
Total Protein: 7.3 g/dL (ref 6.5–8.1)

## 2022-08-18 LAB — CBG MONITORING, ED
Glucose-Capillary: 198 mg/dL — ABNORMAL HIGH (ref 70–99)
Glucose-Capillary: 319 mg/dL — ABNORMAL HIGH (ref 70–99)

## 2022-08-18 MED ORDER — LOSARTAN POTASSIUM 50 MG PO TABS
25.0000 mg | ORAL_TABLET | Freq: Every day | ORAL | Status: DC
  Administered 2022-08-18: 25 mg via ORAL
  Filled 2022-08-18: qty 1

## 2022-08-18 MED ORDER — ACETAMINOPHEN 325 MG PO TABS: 650.0000 mg | ORAL_TABLET | Freq: Four times a day (QID) | ORAL | Status: DC | PRN

## 2022-08-18 MED ORDER — INSULIN ASPART PROT & ASPART (70-30 MIX) 100 UNIT/ML ~~LOC~~ SUSP
11.0000 [IU] | Freq: Two times a day (BID) | SUBCUTANEOUS | Status: DC
  Filled 2022-08-18: qty 10

## 2022-08-18 MED ORDER — INSULIN GLARGINE-YFGN 100 UNIT/ML ~~LOC~~ SOLN
20.0000 [IU] | Freq: Two times a day (BID) | SUBCUTANEOUS | Status: DC
  Filled 2022-08-18: qty 0.2

## 2022-08-18 MED ORDER — ACETAMINOPHEN 650 MG RE SUPP: 650.0000 mg | Freq: Four times a day (QID) | RECTAL | Status: DC | PRN

## 2022-08-18 MED ORDER — ENOXAPARIN SODIUM 30 MG/0.3ML IJ SOSY
30.0000 mg | PREFILLED_SYRINGE | INTRAMUSCULAR | Status: DC
  Administered 2022-08-18: 30 mg via SUBCUTANEOUS
  Filled 2022-08-18: qty 0.3

## 2022-08-18 MED ORDER — INSULIN LISPRO PROT & LISPRO (75-25 MIX) 100 UNIT/ML KWIKPEN
11.0000 [IU] | PEN_INJECTOR | Freq: Two times a day (BID) | SUBCUTANEOUS | 0 refills | Status: DC
  Filled 2022-08-18: qty 15, 68d supply, fill #0

## 2022-08-18 MED ORDER — INSULIN PEN NEEDLE 32G X 4 MM MISC
1.0000 | Freq: Two times a day (BID) | 0 refills | Status: DC
  Filled 2022-08-18 – 2022-09-22 (×2): qty 200, 100d supply, fill #0

## 2022-08-18 MED ORDER — INSULIN ASPART 100 UNIT/ML IJ SOLN
0.0000 [IU] | Freq: Three times a day (TID) | INTRAMUSCULAR | Status: DC
  Administered 2022-08-18: 4 [IU] via SUBCUTANEOUS
  Filled 2022-08-18: qty 1

## 2022-08-18 MED ORDER — LOSARTAN POTASSIUM 25 MG PO TABS
25.0000 mg | ORAL_TABLET | Freq: Every day | ORAL | 0 refills | Status: DC
  Filled 2022-08-18: qty 60, 60d supply, fill #0

## 2022-08-18 MED ORDER — ONDANSETRON HCL 4 MG PO TABS: 4.0000 mg | ORAL_TABLET | Freq: Four times a day (QID) | ORAL | Status: DC | PRN

## 2022-08-18 MED ORDER — PRAVASTATIN SODIUM 40 MG PO TABS
40.0000 mg | ORAL_TABLET | Freq: Every day | ORAL | 0 refills | Status: DC
  Filled 2022-08-18: qty 60, 60d supply, fill #0

## 2022-08-18 MED ORDER — INSULIN GLARGINE-YFGN 100 UNIT/ML ~~LOC~~ SOLN
15.0000 [IU] | Freq: Every day | SUBCUTANEOUS | Status: DC
  Filled 2022-08-18: qty 0.15

## 2022-08-18 MED ORDER — ONDANSETRON HCL 4 MG/2ML IJ SOLN: 4.0000 mg | Freq: Four times a day (QID) | INTRAMUSCULAR | Status: DC | PRN

## 2022-08-18 MED ORDER — TRAZODONE HCL 50 MG PO TABS
25.0000 mg | ORAL_TABLET | Freq: Every day | ORAL | 0 refills | Status: DC
  Filled 2022-08-18: qty 30, 60d supply, fill #0

## 2022-08-18 MED ORDER — INSULIN ASPART 100 UNIT/ML IJ SOLN: 0.0000 [IU] | Freq: Every day | INTRAMUSCULAR | Status: DC

## 2022-08-18 MED ORDER — LOSARTAN POTASSIUM 50 MG PO TABS: 50.0000 mg | ORAL_TABLET | Freq: Every day | ORAL | Status: DC

## 2022-08-18 MED ORDER — SODIUM CHLORIDE 0.9 % IV SOLN: INTRAVENOUS | Status: DC

## 2022-08-18 NOTE — Discharge Summary (Signed)
Physician Discharge Summary   Patient: Brianna Church MRN: [id number]  DOB: 1946-04-30   Admit:     Date of Admission: 08/17/2022 Admitted from: home   Discharge: Date of discharge: 08/18/22 Disposition: Home Condition at discharge: good  CODE STATUS: FULL CODE     Discharge Physician: Sunnie Nielsen, DO [facility]     PCP: Pcp, No  Recommendations for Outpatient Follow-up:  Follow up with PCP in 3-4 weeks Please obtain labs/tests: BMP in 3-4 weeks, hepatic panel and lipid panel in 4-6 weeks.  Please follow up on the following pending results: none   Discharge Instructions     Diet - low sodium heart healthy   Complete by: As directed    Diet Carb Modified   Complete by: As directed    Increase activity slowly   Complete by: As directed          Discharge Diagnoses: Principal Problem:   Hyperglycemia due to type 2 diabetes mellitus (HCC) Active Problems:   AKI (acute kidney injury) (HCC)   Hypertension associated with diabetes (HCC)   GERD (gastroesophageal reflux disease)   Hyperlipidemia associated with type 2 diabetes mellitus (HCC)   Hyponatremia   Hyperglycemia       Hospital Course: Brianna Church is a 76 y.o. female with medical history significant of insulin-dependent diabetes, GERD, essential hypertension, chronic low back pain who is on Humalog 75/25 and has been apparently taking her medications as prescribed but came in with markedly elevated blood sugar over 700.  10/04: in ED  Patient was given IV fluids and initial IV insulin.  Blood sugar has improved slightly and plan is to admit the patient for observation and adjust medications then transition to the subcutaneous insulin. 10/05: glucose improved, adjusted home meds   Consultants:  none  Procedures: none      ASSESSMENT & PLAN:   Principal Problem:   Hyperglycemia due to type 2 diabetes mellitus (HCC) Active Problems:   AKI (acute kidney injury)  (HCC)   Hypertension associated with diabetes (HCC)   GERD (gastroesophageal reflux disease)   Hyperlipidemia associated with type 2 diabetes mellitus (HCC)   Hyponatremia   Hyperglycemia  Hyperglycemia due to uncontrolled DM2 No DKA or HHNK.   Slight response to IV fluids and insulin.   Probably not responding to her current dose of insulin.   Diabetic educator consult and pharmacy consult - appreciate assistance w/ medication management and confirming access to insulin    Essential hypertension:  Blood pressure above goal   Will benefit from ACE/ARB for renal protection in diabetic - started Losartan 25 mg daily    Pseudohyponatremia:  Monitor BMP   GERD:  PPI            Discharge Instructions  Allergies as of 08/18/2022   No Known Allergies      Medication List     STOP taking these medications    omeprazole 20 MG capsule Commonly known as: PRILOSEC       TAKE these medications    Insulin Lispro Prot & Lispro (75-25) 100 UNIT/ML Kwikpen Commonly known as: HumaLOG Mix 75/25 KwikPen Inject 11 Units into the skin 2 (two) times daily. What changed: how much to take   Insulin Pen Needle 32G X 4 MM Misc Use 2 times a day (1 Dose by Does not apply route 2 (two) times daily.)   losartan 25 MG tablet Commonly known as: COZAAR Tome 1 tableta (25 mg en total) por  va oral diariamente. (Take 1 tablet (25 mg total) by mouth daily.) Start taking on: August 19, 2022   pravastatin 40 MG tablet Commonly known as: PRAVACHOL Tome 1 tableta (40 mg en total) por va oral diariamente. (Take 1 tablet (40 mg total) by mouth daily.)   traZODone 50 MG tablet Commonly known as: [place] media tableta por va oral antes de acostarse, segn sea necesario para dormir. (Take 0.5 tablets (25 mg total) by mouth at bedtime as neede for sleep) What changed: how much to take          No Known Allergies   Subjective: interpreter assistance appreciated. Pt  denies pain, CP/SOB, HA/VC/Dizzy. Reports resting well overnight and toelrating diet. NO concerns for going home.    Discharge Exam: BP (!) 157/84   Pulse 71   Temp 99 F (37.2 C) (Oral)   Resp 16   Ht 5\' 1"  (1.549 m)   Wt 59 kg   SpO2 96%   BMI 24.56 kg/m  General: Pt is alert, awake, not in acute distress Cardiovascular: RRR, S1/S2 +, no rubs, no gallops Respiratory: CTA bilaterally, no wheezing, no rhonchi Abdominal: Soft, NT, ND, bowel sounds + Extremities: no edema, no cyanosis     The results of significant diagnostics from this hospitalization (including imaging, microbiology, ancillary and laboratory) are listed below for reference.     Microbiology: No results found for this or any previous visit (from the past 240 hour(s)).   Labs: BNP (last 3 results) No results for input(s): "BNP" in the last 8760 hours. Basic Metabolic Panel: Recent Labs  Lab 08/17/22 1936 08/18/22 0544  NA 130* 137  K 4.6 3.6  CL 97* 103  CO2 23 26  GLUCOSE 684* 189*  BUN 15 11  CREATININE 1.43* 0.96  CALCIUM 8.3* 8.4*   Liver Function Tests: Recent Labs  Lab 08/17/22 1936 08/18/22 0544  AST 35 30  ALT 17 17  ALKPHOS 102 87  BILITOT 0.9 1.0  PROT 7.5 7.3  ALBUMIN 3.7 3.7   No results for input(s): "LIPASE", "AMYLASE" in the last 168 hours. No results for input(s): "AMMONIA" in the last 168 hours. CBC: Recent Labs  Lab 08/17/22 1936 08/18/22 0544  WBC 3.2* 3.8*  NEUTROABS 2.0  --   HGB 11.5* 12.1  HCT 37.5 38.6  MCV 88.9 87.1  PLT 85* 110*   Cardiac Enzymes: No results for input(s): "CKTOTAL", "CKMB", "CKMBINDEX", "TROPONINI" in the last 168 hours. BNP: Invalid input(s): "POCBNP" CBG: Recent Labs  Lab 08/17/22 2056 08/17/22 2221 08/18/22 0759 08/18/22 1136  GLUCAP 534* 312* 198* 319*   D-Dimer No results for input(s): "DDIMER" in the last 72 hours. Hgb A1c No results for input(s): "HGBA1C" in the last 72 hours. Lipid Profile No results for  input(s): "CHOL", "HDL", "LDLCALC", "TRIG", "CHOLHDL", "LDLDIRECT" in the last 72 hours. Thyroid function studies No results for input(s): "TSH", "T4TOTAL", "T3FREE", "THYROIDAB" in the last 72 hours.  Invalid input(s): "FREET3" Anemia work up No results for input(s): "VITAMINB12", "FOLATE", "FERRITIN", "TIBC", "IRON", "RETICCTPCT" in the last 72 hours. Urinalysis    Component Value Date/Time   COLORURINE STRAW (A) 08/17/2022 2309   APPEARANCEUR CLEAR (A) 08/17/2022 2309   LABSPEC 1.016 08/17/2022 2309   PHURINE 7.0 08/17/2022 2309   GLUCOSEU >=500 (A) 08/17/2022 2309   HGBUR NEGATIVE 08/17/2022 [place] NEGATIVE 08/17/2022 2309   KETONESUR NEGATIVE 08/17/2022 2309   PROTEINUR NEGATIVE 08/17/2022 2309   NITRITE NEGATIVE 08/17/2022 2309  LEUKOCYTESUR NEGATIVE 08/17/2022 2309   Sepsis Labs Recent Labs  Lab 08/17/22 1936 08/18/22 0544  WBC 3.2* 3.8*   Microbiology No results found for this or any previous visit (from the past 240 hour(s)). Imaging CT Angio Chest Pulmonary Embolism (PE) W or WO Contrast  Result Date: 08/17/2022 CLINICAL DATA:  Back pain, clinical suspicion for PE EXAM: CT ANGIOGRAPHY CHEST WITH CONTRAST TECHNIQUE: Multidetector CT imaging of the chest was performed using the standard protocol during bolus administration of intravenous contrast. Multiplanar CT image reconstructions and MIPs were obtained to evaluate the vascular anatomy. RADIATION DOSE REDUCTION: This exam was performed according to the departmental dose-optimization program which includes automated exposure control, adjustment of the mA and/or kV according to patient size and/or use of iterative reconstruction technique. CONTRAST:  39mL OMNIPAQUE IOHEXOL 350 MG/ML SOLN COMPARISON:  None Available. FINDINGS: Cardiovascular: There are no intraluminal filling defects in central pulmonary artery branches. Evaluation of small peripheral branches is limited by motion artifacts. There is homogeneous  enhancement in thoracic aorta. Extensive coronary artery calcifications are seen. Mediastinum/Nodes: No significant lymphadenopathy seen. Lungs/Pleura: No focal pulmonary consolidation is seen. There is no pleural effusion or pneumothorax. Upper Abdomen: Air is seen in the intrahepatic bile ducts, possibly suggesting previous sphincterotomy. Small hiatal hernia is seen. Musculoskeletal: No acute findings are seen. Review of the MIP images confirms the above findings. IMPRESSION: There is no evidence of pulmonary artery embolism. There is no evidence of thoracic aortic dissection. There is no focal pulmonary consolidation. Coronary artery disease.  Small hiatal hernia. Other findings as described in the body of the report. Electronically Signed   By: Ernie Avena M.D.   On: 08/17/2022 20:58      Time coordinating discharge: over 30 minutes  SIGNED:  Sunnie Nielsen DO [facility]

## 2022-08-18 NOTE — Inpatient Diabetes Management (Addendum)
Inpatient Diabetes Program Recommendations  AACE/ADA: New Consensus Statement on Inpatient Glycemic Control   Target Ranges:  Prepandial:   less than 140 mg/dL      Peak postprandial:   less than 180 mg/dL (1-2 hours)      Critically ill patients:  140 - 180 mg/dL    Latest Reference Range & Units 08/17/22 20:56 08/17/22 21:03 08/17/22 22:21 08/18/22 07:59  Glucose-Capillary 70 - 99 mg/dL 534 (HH)   Novolog 10 units 312 (H) 198 (H)    Latest Reference Range & Units 08/17/22 19:36  CO2 22 - 32 mmol/L 23  Glucose 70 - 99 mg/dL 684 (HH)  Anion gap 5 - 15  10   Review of Glycemic Control  Diabetes history: DM2 Outpatient Diabetes medications: Humalog 75/25 8 units BID Current orders for Inpatient glycemic control: Semglee 20 units BID, Novolog 0-20 units TID with meals, Novolog 0-5 units QHS  Inpatient Diabetes Program Recommendations:    Insulin: Please consider decreasing Semglee to 15 units daily (based on 59 kg x 0.25 units).  NOTE: Patient admitted with hyperglycemia (lab glucose 684 mg/dl at 19:36 on 08/17/22. Patient was recently inpatient 9/13-9/14/23 and diabetes coordinator spoke with patient (with interpreter) on 07/27/22 and patient's grandson in law on 07/27/22 and 07/28/22.    Addendum 08/18/22@12 :30-Spoke with patient at bedside with on site Spanish interpreter Tammi Klippel). Patient confirms that she was able to get Humalog 75/25 insulin from [facility] after discharge on 07/28/22. Patient states that she has been taking Humalog 75/25 8 units BID as instructed and that she stopped taking her oral DM medications as advised. Patient reports that her glucose was in the 200's mg/dl since being discharged from the hospital; she was checking once a day at night. Discussed checking glucose before breakfast and before supper (before she eats) and then take Humalog 75/25 as prescribed at this hospital discharge. Patient states that she has been in the area for 4 years but  she does go back to [place] which is when she will buy DM medications (while in [place]). Patient states that she does not have a PCP and has not seen a provider in [place] other then through the hospital. Patient reports that she was not given any information for any clinics at last hospital discharge on 07/28/22. Explained that she needs to get established with a local clinic so she will have a provider to follow up with and to also get needed prescriptions. Inquired about knowledge of A1C and glucose goals and patient states she did not know what an A1C was or what glucose goals should be. Explained what an A1C is and discussed A1C of 12.8% on 07/27/22 indicating an average glucose of 321 mg/dl. Discussed A1C and glucose goals. Stressed importance of DM control to prevent complications from uncontrolled DM. Explained that initial glucose 684 mg/dl on 08/17/22 and inquired about why patient thought it may be so high since she reports she has been consistently taking the Humalog 75/25. Patient states it is most likely because she is eating now and before she was not eating much at all. Patient is drinking sugary beverages and lots of fruit. Discussed eliminating sugar from beverages and encouraged patient to limit intake and portion of fruit which has natural sugars. Encouraged patient to take DM medications as prescribed at discharge, to be sure to check glucose at least 2 times per day, and get established with a local clinic for follow up.  Informed patient that I would like Dr.  Sheppard Coil know that she did not have a PCP to follow up with so TOC could assist with follow up.   Patient verbalized understanding of information and has no questions at this time. In looking back at [facility] notes from 07/28/22, patient was given application for [facility]. Communicated with Dr. Sheppard Coil regarding patient not having a provider to follow up with.   Thanks, Barnie Alderman, RN, MSN, [place] Diabetes Coordinator Inpatient  Diabetes Program [phone] (Team Pager from 8am to [place])

## 2022-08-18 NOTE — Progress Notes (Signed)
PHARMACIST - PHYSICIAN COMMUNICATION  CONCERNING:  Enoxaparin (Lovenox) for DVT Prophylaxis    RECOMMENDATION: Patient was prescribed enoxaprin 40mg  q24 hours for VTE prophylaxis.   Filed Weights   08/17/22 1847  Weight: 59 kg (130 lb)    Body mass index is 24.56 kg/m.  Estimated Creatinine Clearance: 28.5 mL/min (A) (by C-G formula based on SCr of 1.43 mg/dL (H)).  Patient is candidate for enoxaparin 30mg  every 24 hours based on CrCl <36ml/min or Weight <45kg  DESCRIPTION: Pharmacy has adjusted enoxaparin dose per [facility] policy.  Patient is now receiving enoxaparin 30 mg every 24 hours   Renda Rolls, PharmD, [facility] 08/18/2022 4:06 AM

## 2022-08-18 NOTE — Hospital Course (Addendum)
Brianna Church is a 76 y.o. female with medical history significant of insulin-dependent diabetes, GERD, essential hypertension, chronic low back pain who is on Humalog 75/25 and has been apparently taking her medications as prescribed but came in with markedly elevated blood sugar over 700.  10/04: in ED  Patient was given IV fluids and initial IV insulin.  Blood sugar has improved slightly and plan is to admit the patient for observation and adjust medications then transition to the subcutaneous insulin. 10/05: glucose improved, adjusted home meds   Consultants:  none  Procedures: none      ASSESSMENT & PLAN:   Principal Problem:   Hyperglycemia due to type 2 diabetes mellitus ([place]) Active Problems:   AKI (acute kidney injury) ([place])   Hypertension associated with diabetes ([place])   GERD (gastroesophageal reflux disease)   Hyperlipidemia associated with type 2 diabetes mellitus ([place])   Hyponatremia   Hyperglycemia  Hyperglycemia due to uncontrolled DM2 No DKA or HHNK.   Slight response to IV fluids and insulin.   Probably not responding to her current dose of insulin.   Diabetic educator consult and pharmacy consult - appreciate assistance w/ medication management and confirming access to insulin    Essential hypertension:  Blood pressure above goal   Will benefit from ACE/ARB for renal protection in diabetic - started Losartan 25 mg daily    Pseudohyponatremia:  Monitor BMP   GERD:  PPI

## 2022-09-20 ENCOUNTER — Other Ambulatory Visit: Payer: Self-pay

## 2022-09-20 ENCOUNTER — Encounter: Payer: Self-pay | Admitting: Gerontology

## 2022-09-20 ENCOUNTER — Ambulatory Visit: Payer: Self-pay | Admitting: Gerontology

## 2022-09-20 VITALS — BP 167/86 | HR 69 | Temp 98.1°F | Resp 16 | Ht 62.0 in | Wt 123.1 lb

## 2022-09-20 DIAGNOSIS — Z7689 Persons encountering health services in other specified circumstances: Secondary | ICD-10-CM

## 2022-09-20 DIAGNOSIS — E1165 Type 2 diabetes mellitus with hyperglycemia: Secondary | ICD-10-CM

## 2022-09-20 DIAGNOSIS — I152 Hypertension secondary to endocrine disorders: Secondary | ICD-10-CM

## 2022-09-20 DIAGNOSIS — E1169 Type 2 diabetes mellitus with other specified complication: Secondary | ICD-10-CM

## 2022-09-20 MED ORDER — LOSARTAN POTASSIUM 50 MG PO TABS
50.0000 mg | ORAL_TABLET | Freq: Every day | ORAL | 0 refills | Status: DC
  Filled 2022-09-20: qty 30, 30d supply, fill #0

## 2022-09-20 MED ORDER — PRAVASTATIN SODIUM 40 MG PO TABS
40.0000 mg | ORAL_TABLET | Freq: Every day | ORAL | 0 refills | Status: DC
  Filled 2022-09-20: qty 30, 30d supply, fill #0

## 2022-09-20 MED ORDER — RIGHTEST GS550 BLOOD GLUCOSE VI STRP
ORAL_STRIP | 0 refills | Status: AC
  Filled 2022-09-20: qty 100, 25d supply, fill #0

## 2022-09-20 MED ORDER — INSULIN LISPRO PROT & LISPRO (75-25 MIX) 100 UNIT/ML KWIKPEN
12.0000 [IU] | PEN_INJECTOR | Freq: Two times a day (BID) | SUBCUTANEOUS | 3 refills | Status: DC
  Filled 2022-09-20: qty 15, 63d supply, fill #0
  Filled 2022-09-22: qty 15, 60d supply, fill #0

## 2022-09-20 MED ORDER — RIGHTEST GL300 LANCETS MISC
0 refills | Status: AC
  Filled 2022-09-20: qty 100, 25d supply, fill #0

## 2022-09-20 MED ORDER — BLOOD PRESSURE KIT
1.0000 | PACK | Freq: Every day | 0 refills | Status: DC
  Filled 2022-09-20: qty 1, fill #0

## 2022-09-20 NOTE — Patient Instructions (Signed)
Plan de alimentacin cardiosaludable Heart-Healthy Eating Plan Muchos factores influyen en la salud cardaca, entre ellos, los hbitos de alimentacin y de ejercicio fsico. La salud del corazn tambin se denomina salud coronaria. El riesgo coronario aumenta cuando hay niveles anormales de grasa (lpidos) en la sangre. Un plan de alimentacin cardiosaludable implica limitar las grasas poco saludables, aumentar las grasas saludables, limitar el consumo de sal (sodio) y hacer otros cambios en la dieta y el estilo de vida. En qu consiste el plan? El mdico podra recomendarle que haga lo siguiente: Que limite la ingesta de grasas al ________ % o menos del total de caloras por da. Que limite la ingesta de grasas saturadas al _________ % o menos del total de caloras por da. Que limite la cantidad de colesterol en su dieta a menos de _________ mg por da. Que limite la cantidad de sodio en su dieta a menos de _________ mg por da. Cules son algunos consejos para seguir este plan? Al cocinar Evite frer los alimentos a la hora de la coccin. Hornear, hervir, grillar y asar a la parrilla son buenas opciones. Otras formas de reducir el consumo de grasas son las siguientes: Quite la piel de las aves. Quite todas las grasas visibles de las carnes. Cocine al vapor las verduras en agua o caldo. Planificacin de las comidas  En las comidas, imagine dividir su plato en cuartos: Llene la mitad del plato con verduras y ensaladas de hojas verdes. Llene un cuarto del plato con cereales integrales. Llene un cuarto del plato con alimentos con protenas magras. Coma de 2 a 4 tazas de verduras cada da. Una taza de verduras equivale a 1 taza (91 g) de brcoli o coliflor, 2 zanahorias medianas, 1 pimiento morrn grande, 1 batata grande, 1 tomate grande, 1 patata blanca mediana, 2 tazas (150 g) de verduras de hoja verde crudas. Coma de 1 a 2 tazas de fruta cada da. Una taza de fruta equivale a 1 manzana  pequea, 1 banana grande, 1 taza (237 g) de fruta mixta, 1 naranja grande,  taza (82 g) de fruta seca, 1 taza (240 ml) de jugo de fruta al 100 %. Consuma ms alimentos con fibra soluble. Entre ellos, se incluyen las manzanas, el brcoli, las zanahorias, los frijoles, los guisantes y la cebada. Trate de consumir de 25 a 30 g de fibra por da. Aumente el consumo de legumbres, frutos secos y semillas a 4 o 5 porciones por semana. Una porcin de frijoles secos o legumbres equivale a 1?4 taza (90 g) cocinados, 1 porcin de frutos secos equivale a  oz (12 almendras, 24 pistachos o 7 mitades de nuez) y 1 porcin de semillas equivale a  oz (8 g). Grasas Elija grasas saludables con mayor frecuencia. Elija las grasas monoinsaturadas y poliinsaturadas, como el aceite de oliva y canola, el aceite de aguacate, la semillas de lino, las nueces, las almendras y las semillas. Consuma ms grasas omega-3. Elija salmn, caballa, sardinas, atn, aceite de lino y semillas de lino molidas. Propngase comer pescado al menos dos veces por semana. Lea las etiquetas de los alimentos detenidamente para identificar los que contienen grasas trans o altas cantidades de grasas saturadas. Limite el consumo de grasas saturadas. Estas se encuentran en productos de origen animal, como carnes, mantequilla y crema. Las grasas saturadas de origen vegetal incluyen aceite de palma, de palmiste y de coco. Evite los alimentos con aceites parcialmente hidrogenados. Estos contienen grasas trans. Entre los ejemplos, se incluyen margarinas en barra, algunas   margarinas untables, galletas dulces y saladas y otros productos horneados. Evite las comidas fritas. Informacin general Consuma ms comida casera y menos de restaurante, de bares y comida rpida. Limite o evite el alcohol. Limite los alimentos con alto contenido de azcar aadido y almidones simples, como los alimentos elaborados con harina blanca refinada (pan blanco, pasteles,  dulces). Baje de peso si es necesario. Perder solo del 5 al 10 % de su peso corporal puede ayudarlo a mejorar su estado de salud general y a prevenir enfermedades, como la diabetes y las enfermedades cardacas. Controle la ingesta de sodio, especialmente si tiene presin arterial alta. Hable con el mdico acerca de cambiar la ingesta de sodio. Intente incorporar ms comidas vegetarianas cada semana. Qu alimentos debo comer? Frutas Frutas frescas, en conserva (en su jugo natural) o frutas congeladas. Verduras Verduras frescas o congeladas (crudas, al vapor, asadas o grilladas). Ensaladas de hojas verdes. Cereales La mayora de los cereales. Elija casi siempre trigo integral y cereales integrales. Arroz y pastas, incluido el arroz integral y las pastas elaboradas con trigo integral. Carnes y otras protenas Carnes magras de res, ternera, cerdo y cordero a las que se les haya quitado la grasa visible. Pollo y pavo sin piel. Todos los pescados y mariscos. Pato salvaje, conejo, faisn y venado. Claras de huevo o sustitutos del huevo bajos en colesterol. Porotos, guisantes y lentejas secos y tofu. Semillas y la mayora de los frutos secos. Lcteos Quesos descremados y semidescremados, entre ellos, ricota y mozzarella. Leche descremada o al 1 % (lquida, en polvo o evaporada). Suero de leche elaborado con leche semidescremada. Yogur descremado o semidescremado. Grasas y aceites Margarinas no hidrogenadas (sin grasas trans). Aceites vegetales, incluido el de soja, ssamo, girasol, oliva, aguacate, man, crtamo, maz, canola y semillas de algodn. Alios para ensalada o mayonesa elaborados con aceite vegetal. Bebidas Agua (mineral o con gas). T y caf. T helado sin endulzar. Bebidas dietticas. Dulces y postres Sorbete, gelatina y helado de frutas. Pequeas cantidades de chocolate amargo. Limite todos los dulces y postres. Alios y condimentos Todos los alios y condimentos. Es posible que los  productos que se enumeran ms arriba no constituyan una lista completa de los alimentos y las bebidas que puede tomar. Consulte a un nutricionista para conocer ms opciones. Qu alimentos debo evitar? Frutas Fruta enlatada en almbar espeso. Frutas con salsa de crema o mantequilla. Frutas fritas. Limite el consumo de coco. Verduras Verduras cocinadas con salsas de queso, crema o mantequilla. Verduras fritas. Cereales Panes elaborados con grasas saturadas o trans, aceites o leche entera. Croissants. Panecillos dulces. Rosquillas. Galletas con alto contenido de grasas, como las que galletas de queso y las papas fritas. Carnes y otras protenas Carnes grasas, como perros calientes, costillas de res, salchichas, tocino, asado de costillar o chuletn. Fiambres con alto contenido de grasas, como salame y mortadela. Caviar. Pato y ganso domsticos. Vsceras, como hgado. Lcteos Crema, crema agria, queso crema y queso cottage con crema. Quesos enteros. Leche entera o al 2 % (lquida, evaporada o condensada). Suero de leche entero. Salsa de crema o queso con alto contenido de grasas. Yogur de leche entera. Grasas y aceites Grasa de carne o materia grasa. Manteca de cacao, aceites hidrogenados, aceite de palma, aceite de coco, aceite de palmiste. Grasas y materias grasas slidas, incluida la grasa del tocino, el cerdo salado, la manteca de cerdo y la mantequilla. Sustitutos no lcteos de la crema. Aderezos para ensalada con queso o crema agria. Bebidas Refrescos regulares y cualquier bebida   con agregado de azcar. Dulces y [employer]. Pudin. Galletas dulces. Bizcochuelos. Pasteles. Chocolate con leche o chocolate blanco. Almbares con mantequilla. Helados o bebidas elaboradas con helado con alto contenido de grasas. Es posible que los productos que se enumeran ms arriba no sean una lista completa de los alimentos y las bebidas que se Higher education careers adviser. Consulte a un nutricionista para obtener ms  informacin. Resumen La planificacin de comidas cardiosaludables implica limitar las grasas poco saludables, aumentar las grasas saludables, limitar el consumo de sal (sodio) y Field seismologist otros cambios en la dieta y el estilo de [place]. Baje de peso si es necesario. Perder solo del 5 al 10 % de su peso corporal puede ayudarlo a mejorar su estado de salud general y a Producer, television/film/video, como la diabetes y las enfermedades cardacas. Propngase seguir una dieta equilibrada, que incluya frutas y verduras, productos lcteos descremados o semidescremados, protenas magras, frutos secos y legumbres, cereales integrales y aceites y grasas cardiosaludables. Esta informacin no tiene Marine scientist el consejo del mdico. Asegrese de hacerle al mdico cualquier pregunta que tenga. Document Revised: 12/22/2021 Document Reviewed: 12/22/2021 Elsevier Patient Education  [place] de alimentacin DASH DASH Eating Plan DASH es la sigla en ingls de "Enfoques Alimentarios para Detener la Hipertensin". El plan de alimentacin DASH ha demostrado: [place]ajar la presin arterial elevada (hipertensin). Reducir el riesgo de diabetes tipo 2, enfermedad cardaca y accidente cerebrovascular. Ayudar a perder peso. Consejos para seguir Photographer las etiquetas de los alimentos Verifique la cantidad de sal (sodio) por porcin en las etiquetas de los alimentos. Elija alimentos con menos del 5 por ciento del valor diario de sodio. Generalmente, los alimentos con menos de 300 miligramos (mg) de sodio por porcin se encuadran dentro de este plan alimentario. Para encontrar cereales integrales, busque la palabra "integral" como primera palabra en la lista de ingredientes. Al ir de compras Compre productos en los que en su etiqueta diga: "bajo contenido de sodio" o "sin agregado de sal". Compre alimentos frescos. Evite los alimentos enlatados y comidas precocidas o congeladas. Al cocinar Evite agregar sal  cuando cocine. Use hierbas o aderezos sin sal, en lugar de sal de mesa o sal marina. Consulte al mdico o farmacutico antes de usar sustitutos de la sal. No fra los alimentos. A la hora de cocinar los alimentos opte por hornearlos, hervirlos, grillarlos, asarlos al horno y asarlos a Administrator, arts. Cocine con aceites cardiosaludables, como oliva, canola, aguacate, soja o girasol. Planificacin de las comidas  Consuma una dieta equilibrada, que incluya lo siguiente: 4 o ms porciones de frutas y 4 o ms porciones de Set designer. Trate de que medio plato de cada comida sea de frutas y verduras. De 6 a 8 porciones de [employer]. Menos de 6 onzas (170 g) de carne, aves o pescado Games developer. Una porcin de 3 onzas (85 g) de carne tiene casi el mismo tamao que un mazo de cartas. Un huevo equivale a 1 onza (28 g). De 2 a 3 porciones de productos lcteos descremados por da. Una porcin es 1 taza (237 ml). 1 porcin de frutos secos, semillas o frijoles 5 veces por semana. De 2 a 3 porciones de grasas cardiosaludables. Las grasas saludables llamadas cidos grasos omega-3 se encuentran en alimentos como las nueces, las semillas de [place], las leches fortificadas y [place]. Estas grasas tambin se encuentran en los pescados de agua fra, como la sardina, el salmn y la caballa.  Limite la cantidad que consume de: Alimentos enlatados o envasados. Alimentos con alto contenido de grasa trans, como algunos alimentos fritos. Alimentos con alto contenido de grasa saturada, como carne con grasa. Postres y otros dulces, bebidas azucaradas y otros alimentos con azcar agregada. Productos lcteos enteros. No le agregue sal a los alimentos antes de probarlos. No coma ms de 4 yemas de huevo por semana. Trate de comer al menos 2 comidas vegetarianas por semana. Consuma ms comida casera y menos de restaurante, de bares y comida rpida. Estilo de vida Cuando coma en un restaurante,  pida que preparen su comida con menos sal o, en lo posible, sin nada de sal. Si bebe alcohol: Limite la cantidad que bebe: De 0 a 1 medida por da para las mujeres que no estn embarazadas. De 0 a 2 medidas por da para los hombres. Est atento a la cantidad de alcohol que hay en las bebidas que toma. En los [place], una medida equivale a una botella de cerveza de 12 oz (355 ml), un vaso de vino de 5 oz (148 ml) o un vaso de una bebida alcohlica de alta graduacin de 1 oz (44 ml). Informacin general Evite ingerir ms de 2300 mg de sal por da. Si tiene hipertensin, es posible que necesite reducir la ingesta de sodio a 1,500 mg por da. Trabaje con su mdico para mantener un peso saludable o perder [employer]. Pregntele cul es el peso recomendado para usted. Realice al menos 30 minutos de ejercicio que haga que se acelere su corazn (ejercicio Arboriculturist) la [employer] de la [place]. Estas actividades pueden incluir caminar, nadar o andar en bicicleta. Trabaje con su mdico o nutricionista para ajustar su plan alimentario a sus necesidades calricas personales. Qu alimentos debo comer? Frutas Todas las frutas frescas, congeladas o disecadas. Frutas enlatadas en jugo natural (sin agregado de azcar). Verduras Verduras frescas o congeladas (crudas, al vapor, asadas o grilladas). Jugos de tomate y verduras con bajo contenido de sodio o reducidos en sodio. Salsa y pasta de tomate con bajo contenido de sodio o reducidas en sodio. Verduras enlatadas con bajo contenido de sodio o reducidas en sodio. Granos Pan de salvado o integral. Pasta de salvado o integral. Arroz integral. Avena. Quinua. Trigo burgol. Cereales integrales y con bajo contenido de sodio. Pan pita. Galletitas de [place] con bajo contenido de [place] y [place]. Tortillas de [place] integral. Carnes y otras protenas Pollo o pavo sin piel. Carne de pollo o de [place]. Cerdo desgrasado. Pescado y [employer]. Claras de huevo.  Porotos, guisantes o lentejas secos. Frutos secos, mantequilla de frutos secos y semillas sin sal. Frijoles enlatados sin sal. Cortes de carne vacuna magra, desgrasada. Carne precocida o curada magra y baja en sodio, como embutidos o panes de carne. Lcteos Leche descremada (1 %) o descremada. Quesos reducidos en grasa, con bajo contenido de grasa o descremados. Queso blanco o ricota sin grasa, con bajo contenido de Stephan. Yogur semidescremado o descremado. Queso con bajo contenido de [place] y [place]. Grasas y [employer] untables que no contengan grasas trans. Aceite vegetal. Lubertha Basque y aderezos para ensaladas livianos, reducidos en grasa o con bajo contenido de grasas (reducidos en sodio). Aceite de canola, crtamo, oliva, aguacate, soja y [place]. Aguacate. Alios y condimentos Hierbas. Especias. Mezclas de condimentos sin sal. Otros alimentos Palomitas de maz y pretzels sin sal. Dulces con bajo contenido de grasas. Es posible que los productos que se enumeran ms [place] no constituyan una lista [place] de  los alimentos y las bebidas que puede tomar. Consulte a un nutricionista para obtener ms informacin. Qu alimentos debo evitar? Nils Pyle Fruta enlatada en almbar liviano o espeso. Frutas cocidas en aceite. Frutas con salsa de crema o mantequilla. Verduras Verduras con crema o fritas. Verduras en salsa de [place]. Verduras enlatadas regulares (que no sean con bajo contenido de sodio o reducidas en sodio). Pasta y salsa de tomates enlatadas regulares (que no sean con bajo contenido de sodio o reducidas en sodio). Jugos de tomate y verduras regulares (que no sean con bajo contenido de sodio o reducidos en sodio). Pepinillos. Aceitunas. Granos Productos de panificacin hechos con grasa, como medialunas, magdalenas y algunos panes. Comidas con arroz o pasta seca listas para usar. Carnes y [address] de carne con alto contenido de Holiday representative. Costillas. Carne frita. [place]. Mortadela,  salame y otras carnes precocidas o curadas, como embutidos o panes de carne. Grasa de la espalda del cerdo (panceta). Salchicha de cerdo. Frutos secos y semillas con sal. Frijoles enlatados con agregado de sal. Pescado enlatado o ahumado. Huevos enteros o yemas. Pollo o pavo con piel. Lcteos Leche entera o al 2 %, crema y [address] y mitad crema. Queso crema entero o con toda su grasa. Yogur entero o endulzado. Quesos con toda su grasa. Sustitutos de cremas no lcteas. Coberturas batidas. Quesos para untar y quesos procesados. Grasas y [employer]. Margarina en barra. Manteca de cerdo. Lardo. Mantequilla clarificada. Grasa de panceta. Aceites tropicales como aceite de coco, palmiste o palma. Alios y condimentos Sal de cebolla, sal de ajo, sal condimentada, sal de mesa y sal marina. Salsa [place]. Salsa trtara. Salsa barbacoa. Salsa teriyaki. Salsa de soja, incluso la que tiene contenido reducido de [place]. Salsa de carne. Salsas en lata y envasadas. Salsa de pescado. Salsa de [place]. Salsa rosada. Rbanos picantes comprados en tiendas. [place]. [place]. Saborizantes y tiernizantes para carne. Caldo en cubitos. Salsas picantes. Adobos preelaborados o envasados. Aderezos para tacos preelaborados o envasados. Salsas de pepinillos. Aderezos comunes para ensalada. Otros alimentos Palomitas de maz y pretzels con sal. Es posible que los productos que se enumeran ms arriba no constituyan una lista completa de los alimentos y las bebidas que Personnel officer. Consulte a un nutricionista para obtener ms informacin. Dnde buscar ms informacin National Heart, Lung, and Blood Institute (Instituto Nacional del [place], los Pulmones y Risk manager): PopSteam.is American Heart Association (Asociacin Estadounidense del Corazn): www.heart.org Academy of Nutrition and Dietetics (Academia de Nutricin y Pension scheme manager): www.eatright.org National Kidney Foundation (Fundacin Nacional del Rin):  www.kidney.org Resumen El plan de alimentacin DASH ha demostrado bajar la presin arterial elevada (hipertensin). Tambin puede reducir [employer] de diabetes tipo 2, enfermedad cardaca y accidente cerebrovascular. Cuando siga el plan de alimentacin DASH, trate de comer ms frutas frescas y verduras, cereales integrales, carnes magras, lcteos descremados y grasas cardiosaludables. Con el plan de alimentacin DASH, deber limitar el consumo de sal (sodio) a 2,300 mg por da. Si tiene hipertensin, es posible que necesite reducir la ingesta de sodio a 1,500 mg por da. Trabaje con su mdico o nutricionista para ajustar su plan alimentario a sus necesidades calricas personales. Esta informacin no tiene Theme park manager el consejo del mdico. Asegrese de hacerle al mdico cualquier pregunta que tenga. Document Revised: 12/05/2019 Document Reviewed: 12/05/2019 Elsevier Patient Education  2023 Elsevier Inc. Recuento de carbohidratos para la diabetes mellitus en los adultos Carbohydrate Counting for Diabetes Mellitus, Adult El recuento de carbohidratos es un mtodo para llevar un registro de  la cantidad de carbohidratos que se ingieren. La ingesta de carbohidratos aumenta la cantidad de azcar (glucosa) en la sangre. El recuento de la cantidad de carbohidratos que ingiere mejora el control de su glucemia. Esto, a su vez, le ayuda a controlar su diabetes. Los carbohidratos se miden en gramos (g) por porcin. Es importante saber la cantidad de carbohidratos (en gramos o por tamao de porcin) que se puede ingerir en cada comida. Esto es Government social research officer. Un nutricionista puede ayudarlo a crear un plan de alimentacin y a calcular la cantidad de carbohidratos que debe ingerir en cada comida y colacin. Qu alimentos contienen carbohidratos? Los siguientes alimentos incluyen carbohidratos: Granos, como panes y cereales. Frijoles secos y productos con soja. Verduras con almidn, como papas,  guisantes y maz. Nils Pyle y jugos de frutas. Leche y Dentist. Dulces y colaciones, como pasteles, galletas, caramelos, papas fritas de bolsa y refrescos. Cmo se calculan los carbohidratos de los alimentos? Hay dos maneras de calcular los carbohidratos de los alimentos. Puede leer las etiquetas de los alimentos o aprender cules son los tamaos de las porciones estndar de los alimentos. Puede usar cualquiera de estos mtodos o una combinacin de [place]. Usar la Air cabin crew de informacin nutricional La lista de Informacin nutricional est incluida en las etiquetas de casi todas las bebidas y los alimentos envasados de los [place]. Esto incluye lo siguiente: El tamao de la porcin. Informacin sobre los nutrientes de cada porcin, incluidos los gramos de carbohidratos por porcin. Para usar la Informacin nutricional, decida cuntas porciones tomar. Luego, multiplique el nmero de porciones por la cantidad de carbohidratos por porcin. El nmero resultante es la cantidad total de carbohidratos que comer. Conocer los tamaos de las porciones estndar de los alimentos Cuando coma alimentos que contengan carbohidratos y que no estn envasados o no incluyan la informacin nutricional en la etiqueta, debe medir las porciones para poder calcular los gramos de carbohidratos. Mida los alimentos que comer con una balanza de alimentos o una taza medidora, si es necesario. Decida cuntas porciones de Programmer, systems. Multiplique el nmero de porciones por 15. En los alimentos que contienen carbohidratos, una porcin [place] a 15 g de carbohidratos. Por ejemplo, si come 2 tazas o 10 onzas (300 g) de fresas, habr comido 2 porciones y 30 g de carbohidratos (2 porciones x 15 g = 30 g). En el caso de las comidas que contienen mezclas de ms de un alimento, como las sopas y los guisos, debe calcular los carbohidratos de cada alimento que se incluye. La siguiente lista contiene los tamaos de  porciones estndar de los alimentos ricos en carbohidratos ms comunes. Cada una de estas porciones tiene aproximadamente 15 g de carbohidratos: 1 rebanada de pan. 1 tortilla de seis pulgadas (15 cm). ? de taza o 2 onzas (53 g) de arroz o pasta cocidos.  taza o 3 onzas (85 g) de lentejas o frijoles cocidos o enlatados, escurridos y enjuagados.  taza o 3 onzas (85 g) de verduras con almidn, como guisantes, maz o zapallo.  taza o 4 onzas (120 g) de cereal caliente.  taza o 3 onzas (85 g) de papas hervidas o en pur, o  o 3 onzas (85 g) de una papa grande al horno.  taza o 4 onzas fluidas (118 ml) de jugo de frutas. 1 taza u 8 onzas fluidas (237 ml) de leche. 1 unidad pequea o 4 onzas (106 g) de manzana.  unidad o 2 onzas (63 g) de una banana  mediana. 1 taza o 5 oz (150 g) de fresas. 3 tazas o 1 oz (28.3 g) de palomitas de maz. Cul sera un ejemplo de recuento de carbohidratos? Para calcular los gramos de carbohidratos de este ejemplo de comida, siga los pasos que se describen a continuacin. Ejemplo de comida 3 onzas (85 g) de pechugas de pollo. ? de taza o 4 onzas (106 g) de arroz integral.  taza o 3 onzas (85 g) de maz. 1 taza u 8 onzas fluidas (237 ml) de leche. 1 taza o 5 onzas (150 g) de fresas con crema batida sin azcar. Clculo de carbohidratos Identifique los alimentos que contienen carbohidratos: Arroz. Maz. Leche. Hughie Closs. Calcule cuntas porciones come de cada alimento: 2 porciones de arroz. 1 porcin de maz. [place]. 1 porcin de fresas. Multiplique cada nmero de porciones por 15 g: 2 porciones de arroz x 15 g = 30 g. 1 porcin de maz x 15 g = 15 g. 1 porcin de leche x 15 g = 15 g. 1 porcin de fresas x 15 g = 15 g. Sume todas las cantidades para conocer el total de gramos de carbohidratos consumidos: 30 g + 15 g + 15 g + 15 g = 75 g de carbohidratos en total. Consejos para seguir este plan Al ir de compras [place] un plan de comidas  y luego haga una lista de compras. Compre verduras frescas y congeladas, frutas frescas y congeladas, productos lcteos, huevos, frijoles, lentejas y cereales integrales. Fjese en las etiquetas de los alimentos. Elija los alimentos que tengan ms fibra y Surveyor, minerals. Evite los alimentos procesados y los alimentos con Nurse, learning disability. Planificacin de las comidas Trate de consumir la misma cantidad de gramos de carbohidratos en cada comida y en cada colacin. Planifique tomar comidas y colaciones regulares y equilibradas. Dnde buscar ms informacin American Diabetes Association (Asociacin Estadounidense de la Diabetes): diabetes.org Centers for Disease Control and Prevention (Centros para el Control y la Prevencin de [place]): StoreMirror.com.cy Academy of Nutrition and Dietetics (Academia de Nutricin y Information systems manager): Loss adjuster, chartered.org Association of Diabetes Care & Education Specialists (Asociacin de Especialistas en Atencin y Educacin sobre la Diabetes): diabeteseducator.org Resumen El recuento de carbohidratos es un mtodo para llevar un registro de la cantidad de carbohidratos que se ingieren. La ingesta de carbohidratos aumenta la cantidad de azcar (glucosa) en la sangre. El recuento de la cantidad de carbohidratos que ingiere mejora el control de su glucemia. Esto le ayuda a Chief Technology Officer su diabetes. Un nutricionista puede ayudarlo a crear un plan de alimentacin y a calcular la cantidad de carbohidratos que debe ingerir en cada comida y colacin. Esta informacin no tiene Marine scientist el consejo del mdico. Asegrese de hacerle al mdico cualquier pregunta que tenga. Document Revised: 07/21/2020 Document Reviewed: 07/21/2020 Elsevier Patient Education  [place].

## 2022-09-20 NOTE — Progress Notes (Unsigned)
New Patient Office Visit  Subjective    Patient ID: Brianna Church, female    DOB: September 03, 1946  Age: 76 y.o. MRN: [id number]  CC: No chief complaint on file.   HPI Brianna Church is a 76 y/o female who has history of type 2 diabetes, hyperlipidemia, hypertension,presents to re establish care. She was lost to follow up 4 years ago. She was discharged from the [facility] hospital on 08/18/22,during her hospital course, she was treated hyperglycemia, Acute kidney injury, hypertension, hyperlipidemia and Gerd. Her HgbA1c done on 07/27/22 was 12.8% and she continues on Insulin Lispro 75/25 mix 11 units bid, she checks her blood glucose daily, in the afternoon. She states that it's between 180-200 mg/dl. Her blood glucose was 408 mg/dl when checked during visit. She admits poly uria, polydipsia**** Her blood pressure is elevated during visit at 182/84, she states that she's compliant with taking Losartan 25 mg daily, denies side effects. She denies chest pain, palpitation, headache, light headedness and vision changes.   Outpatient Encounter Medications as of 09/20/2022  Medication Sig   Insulin Lispro Prot & Lispro (HUMALOG MIX 75/25 KWIKPEN) (75-25) 100 UNIT/ML Kwikpen Inject 11 Units into the skin 2 (two) times daily.   Insulin Pen Needle 32G X 4 MM MISC 1 Dose by Does not apply route 2 (two) times daily.   losartan (COZAAR) 25 MG tablet Take 1 tablet (25 mg total) by mouth daily.   pravastatin (PRAVACHOL) 40 MG tablet Take 1 tablet (40 mg total) by mouth daily.   traZODone (DESYREL) 50 MG tablet Take 0.5 tablets (25 mg total) by mouth at bedtime as neede for sleep   No facility-administered encounter medications on file as of 09/20/2022.    Past Medical History:  Diagnosis Date   Diabetes mellitus without complication (HCC)    GERD (gastroesophageal reflux disease)    Hypertension     Past Surgical History:  Procedure Laterality Date   ABDOMINAL HYSTERECTOMY     CHOLECYSTECTOMY       No family history on file.  Social History   Socioeconomic History   Marital status: Widowed    Spouse name: Not on file   Number of children: 6   Years of education: 2nd grade   Highest education level: Not on file  Occupational History   Occupation: at home  Tobacco Use   Smoking status: Never   Smokeless tobacco: Never  Substance and Sexual Activity   Alcohol use: No   Drug use: No   Sexual activity: Not on file  Other Topics Concern   Not on file  Social History Narrative   In [place] from October - February so doesn't need help until she's back! But then would really appreciate it, especially transportation.   Social Determinants of Health   Financial Resource Strain: High Risk (08/07/2018)   Overall Financial Resource Strain (CARDIA)    Difficulty of Paying Living Expenses: Very hard  Food Insecurity: Unknown (07/27/2022)   Hunger Vital Sign    Worried About Running Out of Food in the Last Year: Patient refused    Ran Out of Food in the Last Year: Patient refused  Transportation Needs: Unmet Transportation Needs (08/07/2018)   PRAPARE - Administrator, Civil Service (Medical): Yes    Lack of Transportation (Non-Medical): Yes  Physical Activity: Not on file  Stress: Not on file  Social Connections: Not on file  Intimate Partner Violence: Not At Risk (08/07/2018)   Humiliation, Afraid, Rape, and Kick questionnaire  Fear of Current or Ex-Partner: No    Emotionally Abused: No    Physically Abused: No    Sexually Abused: No    Review of Systems  Constitutional: Negative.   HENT: Negative.    Eyes: Negative.   Respiratory: Negative.    Cardiovascular: Negative.   Gastrointestinal: Negative.   Genitourinary: Negative.   Musculoskeletal: Negative.   Skin: Negative.   Neurological: Negative.   Endo/Heme/Allergies:  Positive for polydipsia.  Psychiatric/Behavioral: Negative.          Objective    There were no vitals taken for this  visit.  Physical Exam HENT:     Head: Normocephalic and atraumatic.     Nose: Nose normal.     Mouth/Throat:     Mouth: Mucous membranes are moist.  Eyes:     Extraocular Movements: Extraocular movements intact.     Conjunctiva/sclera: Conjunctivae normal.     Pupils: Pupils are equal, round, and reactive to light.  Cardiovascular:     Rate and Rhythm: Normal rate and regular rhythm.     Pulses: Normal pulses.     Heart sounds: Normal heart sounds.  Pulmonary:     Effort: Pulmonary effort is normal.     Breath sounds: Normal breath sounds.  Abdominal:     General: Abdomen is flat. Bowel sounds are normal.     Palpations: Abdomen is soft.  Genitourinary:    Comments: Deferred per patient Musculoskeletal:        General: Normal range of motion.     Cervical back: Normal range of motion.  Skin:    General: Skin is warm.  Neurological:     General: No focal deficit present.     Mental Status: She is alert and oriented to person, place, and time. Mental status is at baseline.  Psychiatric:        Mood and Affect: Mood normal.        Behavior: Behavior normal.        Thought Content: Thought content normal.        Judgment: Judgment normal.         Assessment & Plan:     No follow-ups on file.   Howard Patton Jerold Coombe, NP

## 2022-09-20 NOTE — Progress Notes (Unsigned)
AMN interpreter services used for today's visit, id# [phone]

## 2022-09-21 ENCOUNTER — Other Ambulatory Visit: Payer: Self-pay

## 2022-09-21 DIAGNOSIS — E1165 Type 2 diabetes mellitus with hyperglycemia: Secondary | ICD-10-CM

## 2022-09-21 DIAGNOSIS — E1169 Type 2 diabetes mellitus with other specified complication: Secondary | ICD-10-CM

## 2022-09-22 ENCOUNTER — Other Ambulatory Visit: Payer: Self-pay

## 2022-09-24 LAB — LIPID PANEL
Chol/HDL Ratio: 2.6 ratio (ref 0.0–4.4)
Cholesterol, Total: 134 mg/dL (ref 100–199)
HDL: 51 mg/dL (ref >39)
LDL Chol Calc (NIH): 62 mg/dL (ref 0–99)
Triglycerides: 114 mg/dL (ref 0–149)
VLDL Cholesterol Cal: 21 mg/dL (ref 5–40)

## 2022-09-24 LAB — BASIC METABOLIC PANEL
BUN/Creatinine Ratio: 14 (ref 12–28)
BUN: 14 mg/dL (ref 8–27)
CO2: 24 mmol/L (ref 20–29)
Calcium: 8.5 mg/dL — ABNORMAL LOW (ref 8.7–10.3)
Chloride: 102 mmol/L (ref 96–106)
Creatinine, Ser: 0.98 mg/dL (ref 0.57–1.00)
Glucose: 243 mg/dL — ABNORMAL HIGH (ref 70–99)
Potassium: 4 mmol/L (ref 3.5–5.2)
Sodium: 138 mmol/L (ref 134–144)
eGFR: 61 mL/min/{1.73_m2} (ref >59)

## 2022-09-24 LAB — MICROALBUMIN / CREATININE URINE RATIO
Creatinine, Urine: 52.1 mg/dL
Microalb/Creat Ratio: 1125 mg/g creat — ABNORMAL HIGH (ref 0–29)
Microalbumin, Urine: 585.9 ug/mL

## 2022-09-27 ENCOUNTER — Other Ambulatory Visit: Payer: Self-pay

## 2022-09-27 ENCOUNTER — Ambulatory Visit: Payer: Self-pay | Admitting: Gerontology

## 2022-09-27 VITALS — BP 182/88 | HR 73 | Wt 120.9 lb

## 2022-09-27 DIAGNOSIS — E1165 Type 2 diabetes mellitus with hyperglycemia: Secondary | ICD-10-CM

## 2022-09-27 DIAGNOSIS — E1159 Type 2 diabetes mellitus with other circulatory complications: Secondary | ICD-10-CM

## 2022-09-27 DIAGNOSIS — E1169 Type 2 diabetes mellitus with other specified complication: Secondary | ICD-10-CM

## 2022-09-27 DIAGNOSIS — G4700 Insomnia, unspecified: Secondary | ICD-10-CM

## 2022-09-27 MED ORDER — PRAVASTATIN SODIUM 40 MG PO TABS
40.0000 mg | ORAL_TABLET | Freq: Every day | ORAL | 0 refills | Status: DC
  Filled 2022-09-27 (×2): qty 90, 90d supply, fill #0

## 2022-09-27 MED ORDER — INSULIN LISPRO PROT & LISPRO (75-25 MIX) 100 UNIT/ML KWIKPEN
15.0000 [IU] | PEN_INJECTOR | Freq: Two times a day (BID) | SUBCUTANEOUS | 3 refills | Status: DC
  Filled 2022-09-27 (×2): qty 15, 50d supply, fill #0

## 2022-09-27 MED ORDER — TRAZODONE HCL 50 MG PO TABS
25.0000 mg | ORAL_TABLET | Freq: Every day | ORAL | 0 refills | Status: DC
  Filled 2022-09-27 (×2): qty 90, 180d supply, fill #0

## 2022-09-27 MED ORDER — LOSARTAN POTASSIUM 100 MG PO TABS
100.0000 mg | ORAL_TABLET | Freq: Every day | ORAL | 0 refills | Status: DC
  Filled 2022-09-27: qty 90, 90d supply, fill #0

## 2022-09-27 MED ORDER — INSULIN PEN NEEDLE 32G X 4 MM MISC
1.0000 | Freq: Two times a day (BID) | 0 refills | Status: DC
  Filled 2022-09-27 (×2): qty 200, 100d supply, fill #0

## 2022-09-27 NOTE — Patient Instructions (Signed)
Recuento de carbohidratos para la diabetes mellitus en los adultos Carbohydrate Counting for Diabetes Mellitus, Adult El recuento de carbohidratos es un mtodo para llevar un registro de la cantidad de carbohidratos que se ingieren. La ingesta de carbohidratos aumenta la cantidad de azcar (glucosa) en la sangre. El recuento de la cantidad de carbohidratos que ingiere mejora el control de su glucemia. Esto, a su vez, le ayuda a controlar su diabetes. Los carbohidratos se miden en gramos (g) por porcin. Es importante saber la cantidad de carbohidratos (en gramos o por tamao de porcin) que se puede ingerir en cada comida. Esto es diferente en cada persona. Un nutricionista puede ayudarlo a crear un plan de alimentacin y a calcular la cantidad de carbohidratos que debe ingerir en cada comida y colacin. Qu alimentos contienen carbohidratos? Los siguientes alimentos incluyen carbohidratos: Granos, como panes y cereales. Frijoles secos y productos con soja. Verduras con almidn, como papas, guisantes y maz. Frutas y jugos de frutas. Leche y yogur. Dulces y colaciones, como pasteles, galletas, caramelos, papas fritas de bolsa y refrescos. Cmo se calculan los carbohidratos de los alimentos? Hay dos maneras de calcular los carbohidratos de los alimentos. Puede leer las etiquetas de los alimentos o aprender cules son los tamaos de las porciones estndar de los alimentos. Puede usar cualquiera de estos mtodos o una combinacin de ambos. Usar la etiqueta de informacin nutricional La lista de Informacin nutricional est incluida en las etiquetas de casi todas las bebidas y los alimentos envasados de los [place]. Esto incluye lo siguiente: El tamao de la porcin. Informacin sobre los nutrientes de cada porcin, incluidos los gramos de carbohidratos por porcin. Para usar la Informacin nutricional, decida cuntas porciones tomar. Luego, multiplique el nmero de porciones por la cantidad  de carbohidratos por porcin. El nmero resultante es la cantidad total de carbohidratos que comer. Conocer los tamaos de las porciones estndar de los alimentos Cuando coma alimentos que contengan carbohidratos y que no estn envasados o no incluyan la informacin nutricional en la etiqueta, debe medir las porciones para poder calcular los gramos de carbohidratos. Mida los alimentos que comer con una balanza de alimentos o una taza medidora, si es necesario. Decida cuntas porciones de tamao estndar comer. Multiplique el nmero de porciones por 15. En los alimentos que contienen carbohidratos, una porcin equivale a 15 g de carbohidratos. Por ejemplo, si come 2 tazas o 10 onzas (300 g) de fresas, habr comido 2 porciones y 30 g de carbohidratos (2 porciones x 15 g = 30 g). En el caso de las comidas que contienen mezclas de ms de un alimento, como las sopas y los guisos, debe calcular los carbohidratos de cada alimento que se incluye. La siguiente lista contiene los tamaos de porciones estndar de los alimentos ricos en carbohidratos ms comunes. Cada una de estas porciones tiene aproximadamente 15 g de carbohidratos: 1 rebanada de pan. 1 tortilla de seis pulgadas (15 cm). ? de taza o 2 onzas (53 g) de arroz o pasta cocidos.  taza o 3 onzas (85 g) de lentejas o frijoles cocidos o enlatados, escurridos y enjuagados.  taza o 3 onzas (85 g) de verduras con almidn, como guisantes, maz o zapallo.  taza o 4 onzas (120 g) de cereal caliente.  taza o 3 onzas (85 g) de papas hervidas o en pur, o  o 3 onzas (85 g) de una papa grande al horno.  taza o 4 onzas fluidas (118 ml) de jugo de frutas. 1 taza u   8 onzas fluidas (237 ml) de leche. 1 unidad pequea o 4 onzas (106 g) de manzana.  unidad o 2 onzas (63 g) de una banana mediana. 1 taza o 5 oz (150 g) de fresas. 3 tazas o 1 oz (28.3 g) de palomitas de maz. Cul sera un ejemplo de recuento de carbohidratos? Para calcular los gramos  de carbohidratos de este ejemplo de comida, siga los pasos que se describen a continuacin. Ejemplo de comida 3 onzas (85 g) de pechugas de pollo. ? de taza o 4 onzas (106 g) de arroz integral.  taza o 3 onzas (85 g) de maz. 1 taza u 8 onzas fluidas (237 ml) de leche. 1 taza o 5 onzas (150 g) de fresas con crema batida sin azcar. Clculo de carbohidratos Identifique los alimentos que contienen carbohidratos: Arroz. Maz. Leche. Brianna Church. Calcule cuntas porciones come de cada alimento: 2 porciones de arroz. 1 porcin de maz. Brianna Church. 1 porcin de fresas. Multiplique cada nmero de porciones por 15 g: 2 porciones de arroz x 15 g = 30 g. 1 porcin de maz x 15 g = 15 g. 1 porcin de leche x 15 g = 15 g. 1 porcin de fresas x 15 g = 15 g. Sume todas las cantidades para conocer el total de gramos de carbohidratos consumidos: 30 g + 15 g + 15 g + 15 g = 75 g de carbohidratos en total. Consejos para seguir este plan Al ir de compras Elabore un plan de comidas y luego haga una lista de compras. Compre verduras frescas y congeladas, frutas frescas y congeladas, productos lcteos, huevos, frijoles, lentejas y cereales integrales. Fjese en las etiquetas de los alimentos. Elija los alimentos que tengan ms fibra y Brianna Church, minerals. Evite los alimentos procesados y los alimentos con Nurse, learning disability. Planificacin de las comidas Trate de consumir la misma cantidad de gramos de carbohidratos en cada comida y en cada colacin. Planifique tomar comidas y colaciones regulares y equilibradas. Dnde buscar ms informacin American Diabetes Association (Asociacin Estadounidense de la Diabetes): diabetes.org Centers for Disease Control and Prevention (Centros para el Control y la Prevencin de [place]): StoreMirror.com.cy Academy of Nutrition and Dietetics (Academia de Nutricin y Information systems manager): Loss adjuster, chartered.org Association of Diabetes Care & Education Specialists (Asociacin de Especialistas en  Atencin y Educacin sobre la Diabetes): diabeteseducator.org Resumen El recuento de carbohidratos es un mtodo para llevar un registro de la cantidad de carbohidratos que se ingieren. La ingesta de carbohidratos aumenta la cantidad de azcar (glucosa) en la sangre. El recuento de la cantidad de carbohidratos que ingiere mejora el control de su glucemia. Esto le ayuda a Chief Technology Officer su diabetes. Un nutricionista puede ayudarlo a crear un plan de alimentacin y a calcular la cantidad de carbohidratos que debe ingerir en cada comida y colacin. Esta informacin no tiene Marine scientist el consejo del mdico. Asegrese de hacerle al mdico cualquier pregunta que tenga. Document Revised: 07/21/2020 Document Reviewed: 07/21/2020 Elsevier Patient Education  Brianna Church Church Eating Plan Church es la sigla en ingls de "Enfoques Alimentarios para Detener la Hipertensin". El plan de alimentacin Church ha demostrado: [place] la presin arterial elevada (hipertensin). Reducir el riesgo de diabetes tipo 2, enfermedad cardaca y accidente cerebrovascular. Ayudar a perder peso. Consejos para seguir Photographer las etiquetas de los alimentos Verifique la cantidad de sal (sodio) por porcin en las etiquetas de los alimentos. Elija alimentos con menos del 5 por ciento del valor diario de sodio. Brianna Church,  los alimentos con menos de 300 miligramos (mg) de sodio por porcin se encuadran dentro de este plan alimentario. Para encontrar cereales integrales, busque la palabra "integral" como primera palabra en la lista de ingredientes. Al ir de compras Compre productos en los que en su etiqueta diga: "bajo contenido de sodio" o "sin agregado de sal". Compre alimentos frescos. Evite los alimentos enlatados y comidas precocidas o congeladas. Al cocinar Evite agregar sal cuando cocine. Use hierbas o aderezos sin sal, en lugar de sal de mesa o sal marina. Consulte al mdico o  farmacutico antes de usar sustitutos de la sal. No fra los alimentos. A la hora de cocinar los alimentos opte por hornearlos, hervirlos, grillarlos, asarlos al horno y asarlos a Administrator, arts. Cocine con aceites cardiosaludables, como oliva, canola, aguacate, soja o girasol. Planificacin de las comidas  Consuma una dieta equilibrada, que incluya lo siguiente: 4 o ms porciones de frutas y 4 o ms porciones de Set designer. Trate de que medio plato de cada comida sea de frutas y verduras. De 6 a 8 porciones de [employer]igroup. Menos de 6 onzas (170 g) de carne, aves o pescado Games developer. Una porcin de 3 onzas (85 g) de carne tiene casi el mismo tamao que un mazo de cartas. Un huevo equivale a 1 onza (28 g). De 2 a 3 porciones de productos lcteos descremados por da. Una porcin es 1 taza (237 ml). 1 porcin de frutos secos, semillas o frijoles 5 veces por semana. De 2 a 3 porciones de grasas cardiosaludables. Las grasas saludables llamadas cidos grasos omega-3 se encuentran en alimentos como las nueces, las semillas de [place], las leches fortificadas y [place]. Estas grasas tambin se encuentran en los pescados de agua fra, como la sardina, el salmn y la caballa. Limite la cantidad que consume de: Alimentos enlatados o envasados. Alimentos con alto contenido de grasa trans, como algunos alimentos fritos. Alimentos con alto contenido de grasa saturada, como carne con grasa. Postres y otros dulces, bebidas azucaradas y otros alimentos con azcar agregada. Productos lcteos enteros. No le agregue sal a los alimentos antes de probarlos. No coma ms de 4 yemas de huevo por semana. Trate de comer al menos 2 comidas vegetarianas por semana. Consuma ms comida casera y menos de restaurante, de bares y comida rpida. Estilo de vida Cuando coma en un restaurante, pida que preparen su comida con menos sal o, en lo posible, sin nada de sal. Si bebe alcohol: Limite la  cantidad que bebe: De 0 a 1 medida por da para las mujeres que no estn embarazadas. De 0 a 2 medidas por da para los hombres. Est atento a la cantidad de alcohol que hay en las bebidas que toma. En los [place], una medida equivale a una botella de cerveza de 12 oz (355 ml), un vaso de vino de 5 oz (148 ml) o un vaso de una bebida alcohlica de alta graduacin de 1 oz (44 ml). Informacin general Evite ingerir ms de 2300 mg de sal por da. Si tiene hipertensin, es posible que necesite reducir la ingesta de sodio a 1,500 mg por da. Trabaje con su mdico para mantener un peso saludable o perder [employer]. Pregntele cul es el peso recomendado para usted. Realice al menos 30 minutos de ejercicio que haga que se acelere su corazn (ejercicio Arboriculturist) la [employer] de la [place]. Estas actividades pueden incluir caminar, nadar o andar en bicicleta. Trabaje con  su mdico o nutricionista para ajustar su plan alimentario a sus necesidades calricas personales. Qu alimentos debo comer? Frutas Todas las frutas frescas, congeladas o disecadas. Frutas enlatadas en jugo natural (sin agregado de azcar). Verduras Verduras frescas o congeladas (crudas, al vapor, asadas o grilladas). Jugos de tomate y verduras con bajo contenido de sodio o reducidos en sodio. Salsa y pasta de tomate con bajo contenido de sodio o reducidas en sodio. Verduras enlatadas con bajo contenido de sodio o reducidas en sodio. Granos Pan de salvado o integral. Pasta de salvado o integral. Arroz integral. Avena. Quinua. Trigo burgol. Cereales integrales y con bajo contenido de sodio. Pan pita. Galletitas de [place] con bajo contenido de [place] y [place]. Tortillas de [place] integral. Carnes y otras protenas Pollo o pavo sin piel. Carne de pollo o de [place]. Cerdo desgrasado. Pescado y [employer]. Claras de huevo. Porotos, guisantes o lentejas secos. Frutos secos, mantequilla de frutos secos y semillas sin sal. Frijoles  enlatados sin sal. Cortes de carne vacuna magra, desgrasada. Carne precocida o curada magra y baja en sodio, como embutidos o panes de carne. Lcteos Leche descremada (1 %) o descremada. Quesos reducidos en grasa, con bajo contenido de grasa o descremados. Queso blanco o ricota sin grasa, con bajo contenido de [place]. Yogur semidescremado o descremado. Queso con bajo contenido de [place] y [place]. Grasas y [employer] untables que no contengan grasas trans. Aceite vegetal. Lubertha Basque y aderezos para ensaladas livianos, reducidos en grasa o con bajo contenido de grasas (reducidos en sodio). Aceite de canola, crtamo, oliva, aguacate, soja y [place]. [place]. Alios y condimentos Hierbas. Especias. Mezclas de condimentos sin sal. Otros alimentos Palomitas de maz y pretzels sin sal. Dulces con bajo contenido de grasas. Es posible que los productos que se enumeran ms [place] no constituyan una lista completa de los alimentos y las bebidas que puede tomar. Consulte a un nutricionista para obtener ms informacin. Qu alimentos debo evitar? Lambert Mody Fruta enlatada en almbar liviano o espeso. Frutas cocidas en aceite. Frutas con salsa de crema o mantequilla. Verduras Verduras con crema o fritas. Verduras en [place]. Verduras enlatadas regulares (que no sean con bajo contenido de sodio o reducidas en sodio). Pasta y salsa de tomates enlatadas regulares (que no sean con bajo contenido de sodio o reducidas en sodio). Jugos de tomate y verduras regulares (que no sean con bajo contenido de sodio o reducidos en sodio). Pepinillos. Aceitunas. Granos Productos de panificacin hechos con grasa, como medialunas, magdalenas y algunos panes. Comidas con arroz o pasta seca listas para usar. Carnes y otras protenas Cortes de carne con alto contenido de Lobbyist. Costillas. Carne frita. [place]. [place], salame y otras carnes precocidas o curadas, como embutidos o panes de carne. Grasa de la espalda del cerdo  (panceta). [place]. Frutos secos y semillas con sal. Frijoles enlatados con agregado de sal. Pescado enlatado o ahumado. Huevos enteros o yemas. Pollo o pavo con piel. Lcteos Leche entera o al 2 %, crema y mitad leche y mitad crema. Queso crema entero o con toda su grasa. Yogur entero o endulzado. Quesos con toda su grasa. Sustitutos de cremas no lcteas. Coberturas batidas. Quesos para untar y quesos procesados. Grasas y [employer]. Margarina en barra. [place]. Lardo. Mantequilla clarificada. Grasa de panceta. Aceites tropicales como aceite de coco, palmiste o palma. Alios y condimentos Sal de cebolla, sal de ajo, sal condimentada, sal de mesa y sal marina. Salsa Worcestershire. Salsa trtara. Salsa barbacoa. Salsa teriyaki. Salsa  de soja, incluso la que tiene contenido reducido de [place]. Salsa de carne. Salsas en lata y envasadas. Salsa de pescado. Salsa de [place]. Salsa rosada. Rbanos picantes comprados en tiendas. [place]. Mostaza. Saborizantes y tiernizantes para carne. Caldo en cubitos. Salsas picantes. Adobos preelaborados o envasados. Aderezos para tacos preelaborados o envasados. Salsas de pepinillos. Aderezos comunes para ensalada. Otros alimentos Palomitas de maz y pretzels con sal. Es posible que los productos que se enumeran ms arriba no constituyan una lista completa de los alimentos y las bebidas que Personnel officer. Consulte a un nutricionista para obtener ms informacin. Dnde buscar ms informacin National Heart, Lung, and Blood Institute (Instituto Nacional del [place], los Pulmones y Risk manager): PopSteam.is American Heart Association (Asociacin Estadounidense del Cor[place]n): www.heart.org Academy of Nutrition and Dietetics (Academia de Nutricin y Pension scheme manager): www.eatright.org National Kidney Foundation (Fundacin Nacional del Rin): www.kidney.org Resumen El plan de alimentacin Church ha demostrado bajar la presin arterial elevada  (hipertensin). Tambin puede reducir [employer] de diabetes tipo 2, enfermedad cardaca y accidente cerebrovascular. Cuando siga el plan de alimentacin Church, trate de comer ms frutas frescas y verduras, cereales integrales, carnes magras, lcteos descremados y grasas cardiosaludables. Con el plan de alimentacin Church, deber limitar el consumo de sal (sodio) a 2,300 mg por da. Si tiene hipertensin, es posible que necesite reducir la ingesta de sodio a 1,500 mg por da. Trabaje con su mdico o nutricionista para ajustar su plan alimentario a sus necesidades calricas personales. Esta informacin no tiene Theme park manager el consejo del mdico. Asegrese de hacerle al mdico cualquier pregunta que tenga. Document Revised: 12/05/2019 Document Reviewed: 12/05/2019 Elsevier Patient Education  2023 [employer].

## 2022-09-27 NOTE — Progress Notes (Signed)
Established Patient Office Visit  Subjective   Patient ID: Brianna Church, female    DOB: 12-26-1945  Age: 76 y.o. MRN: [id number]  No chief complaint on file.   HPI  Brianna Church is a 76 y/o female who has history of type 2 diabetes, hyperlipidemia, hypertension,presents for follow up of  hypertension and diabetes. The [employer] Spanish interpreter was uses. Her blood presuure was elevated at 182/88, she states that she has not taking her 50 mg Losartan. She brought her 4 day blood pressure log and her SBP ranges between 191- 217 and DBP 95-109.  She denies chest pain, palpitation, headache, dizziness and vision changes. Her HgbA1c done on 07/27/22 was 12.8%, she checks her blood glucose bid, and her morning reading after eating breakfast ranges between 241- 280 mg/dl, and evening readings ranges between 339 -400 mg/dl. She endorses hyperglycemic symptoms, denies peripheral neuropathy and performs daily foot checks.  Her labs done on 09/21/22 were unremarkable except her urine microalbumin/creatinine ratio that was 1,125 mg/g. She states that she will be traveling to [facility] tomorrow for the holidays and will be back end of January 2024. Overall, she states that she's doing well and offers no further complaint.  Review of Systems  Constitutional: Negative.   Eyes: Negative.   Respiratory: Negative.    Cardiovascular: Negative.   Skin: Negative.   Neurological: Negative.   Endo/Heme/Allergies:  Positive for polydipsia.  Psychiatric/Behavioral: Negative.        Objective:     BP (!) 182/88 (BP Location: Right Arm, Patient Position: Sitting, Cuff Size: Large)   Pulse 73   Wt 120 lb 14.4 oz (54.8 kg)   BMI 21.42 kg/m  BP Readings from Last 3 Encounters:  09/27/22 (!) 182/88  09/21/22 (!) 165/78  09/20/22 (!) 167/86   Wt Readings from Last 3 Encounters:  09/27/22 120 lb 14.4 oz (54.8 kg)  09/21/22 123 lb 1.6 oz (55.8 kg)  09/20/22 123 lb 1.6 oz (55.8 kg)      Physical  Exam HENT:     Head: Normocephalic and atraumatic.     Mouth/Throat:     Mouth: Mucous membranes are moist.  Eyes:     Extraocular Movements: Extraocular movements intact.     Conjunctiva/sclera: Conjunctivae normal.     Pupils: Pupils are equal, round, and reactive to light.  Cardiovascular:     Rate and Rhythm: Normal rate and regular rhythm.     Pulses: Normal pulses.     Heart sounds: Normal heart sounds.  Pulmonary:     Effort: Pulmonary effort is normal.     Breath sounds: Normal breath sounds.  Skin:    General: Skin is warm.  Neurological:     General: No focal deficit present.     Mental Status: She is alert and oriented to person, place, and time. Mental status is at baseline.  Psychiatric:        Mood and Affect: Mood normal.        Behavior: Behavior normal.        Thought Content: Thought content normal.        Judgment: Judgment normal.      No results found for any visits on 09/27/22.  Last CBC Lab Results  Component Value Date   WBC 3.8 (L) 08/18/2022   HGB 12.1 08/18/2022   HCT 38.6 08/18/2022   MCV 87.1 08/18/2022   MCH 27.3 08/18/2022   RDW 13.2 08/18/2022   PLT 110 (L) 08/18/2022   Last  metabolic panel Lab Results  Component Value Date   GLUCOSE 243 (H) 09/21/2022   NA 138 09/21/2022   K 4.0 09/21/2022   CL 102 09/21/2022   CO2 24 09/21/2022   BUN 14 09/21/2022   CREATININE 0.98 09/21/2022   EGFR 61 09/21/2022   CALCIUM 8.5 (L) 09/21/2022   PHOS 2.9 07/28/2022   PROT 7.3 08/18/2022   ALBUMIN 3.7 08/18/2022   LABGLOB 2.7 02/01/2018   AGRATIO 1.5 02/01/2018   BILITOT 1.0 08/18/2022   ALKPHOS 87 08/18/2022   AST 30 08/18/2022   ALT 17 08/18/2022   ANIONGAP 8 08/18/2022   Last lipids Lab Results  Component Value Date   CHOL 134 09/21/2022   HDL 51 09/21/2022   LDLCALC 62 09/21/2022   TRIG 114 09/21/2022   CHOLHDL 2.6 09/21/2022   Last hemoglobin A1c Lab Results  Component Value Date   HGBA1C 12.8 (H) 07/27/2022   Last  thyroid functions Lab Results  Component Value Date   TSH 1.950 02/01/2018   Last vitamin D No results found for: "25OHVITD2", "25OHVITD3", "VD25OH"    The 10-year ASCVD risk score (Arnett DK, et al., 2019) is: 54.6%    Assessment & Plan:   1. Type 2 diabetes mellitus with hyperglycemia, with long-term current use of insulin (HCC) - Her diabetes is not controlled, her goal should be less than 8%, her insulin was increased to 15 units bid, was encouraged to continue on low carb/non concentrated sweet diet. She was provided with enough insulin, lancets and test stripes for 3 months. She was advised to go to the ED in [place] with high blood glucose. She was educated on tighter glycemic and blood pressure control, will recheck [place]/creatinine ration, advised to increase water intake. - Insulin Lispro Prot & Lispro (HUMALOG MIX 75/25 KWIKPEN) (75-25) 100 UNIT/ML Kwikpen; Inject 15 Units into the skin 2 (two) times daily.  Dispense: 15 mL; Refill: 3 - Urine Microalbumin w/creat. ratio; Future - HgB A1c; Future  2. Hypertension associated with diabetes ([place]) - Her blood pressure is not controlled, increased her Losartan to 100 mg daily, encouraged to continue on DASH diet. - losartan (COZAAR) 100 MG tablet; Take 1 tablet (100 mg total) by mouth daily.  Dispense: 90 tablet; Refill: 0  3. Hyperlipidemia associated with type 2 diabetes mellitus ([place]) - She will continue on current medication, low fat/cholesterol diet. - pravastatin (PRAVACHOL) 40 MG tablet; Take 1 tablet (40 mg total) by mouth daily.  Dispense: 90 tablet; Refill: 0  4. Insomnia, unspecified type - She will continue on current medication and will [facility] when she comes back from [place]. - traZODone (DESYREL) 50 MG tablet; Take 0.5 tablets (25 mg total) by mouth at bedtime as neede for sleep  Dispense: 90 tablet; Refill: 0    Return in about 3 months (around 12/28/2022).    Brianna Espana Jerold Coombe,  NP

## 2022-10-04 ENCOUNTER — Other Ambulatory Visit: Payer: Self-pay

## 2022-10-10 ENCOUNTER — Other Ambulatory Visit: Payer: Self-pay

## 2022-12-22 ENCOUNTER — Other Ambulatory Visit: Payer: Self-pay

## 2022-12-28 ENCOUNTER — Ambulatory Visit: Payer: Self-pay | Admitting: Gerontology

## 2023-10-17 ENCOUNTER — Other Ambulatory Visit: Payer: Self-pay

## 2023-10-17 ENCOUNTER — Ambulatory Visit: Payer: Self-pay | Admitting: Gerontology

## 2023-10-17 ENCOUNTER — Encounter: Payer: Self-pay | Admitting: Gerontology

## 2023-10-17 VITALS — BP 146/67 | HR 74 | Resp 16 | Wt 131.9 lb

## 2023-10-17 DIAGNOSIS — E1159 Type 2 diabetes mellitus with other circulatory complications: Secondary | ICD-10-CM

## 2023-10-17 DIAGNOSIS — M2550 Pain in unspecified joint: Secondary | ICD-10-CM | POA: Insufficient documentation

## 2023-10-17 DIAGNOSIS — M25571 Pain in right ankle and joints of right foot: Secondary | ICD-10-CM

## 2023-10-17 DIAGNOSIS — Z794 Long term (current) use of insulin: Secondary | ICD-10-CM

## 2023-10-17 DIAGNOSIS — H538 Other visual disturbances: Secondary | ICD-10-CM | POA: Insufficient documentation

## 2023-10-17 DIAGNOSIS — Z0000 Encounter for general adult medical examination without abnormal findings: Secondary | ICD-10-CM | POA: Insufficient documentation

## 2023-10-17 LAB — POCT GLYCOSYLATED HEMOGLOBIN (HGB A1C): Hemoglobin A1C: 11.5 % — AB (ref 4.0–5.6)

## 2023-10-17 LAB — GLUCOSE, POCT (MANUAL RESULT ENTRY): POC Glucose: 435 mg/dL — AB (ref 70–99)

## 2023-10-17 MED ORDER — BLOOD GLUCOSE MONITOR SYSTEM W/DEVICE KIT
PACK | 0 refills | Status: AC
  Filled 2023-10-17: qty 1, fill #0
  Filled 2023-10-31: qty 1, 30d supply, fill #0

## 2023-10-17 MED ORDER — METFORMIN HCL 1000 MG PO TABS: 500.0000 mg | ORAL_TABLET | Freq: Two times a day (BID) | ORAL | 0 refills | Status: DC

## 2023-10-17 MED ORDER — IRBESARTAN 75 MG PO TABS
75.0000 mg | ORAL_TABLET | Freq: Every day | ORAL | 0 refills | Status: DC
  Filled 2023-10-17: qty 40, 40d supply, fill #0

## 2023-10-17 MED ORDER — BASAGLAR KWIKPEN 100 UNIT/ML ~~LOC~~ SOPN
10.0000 [IU] | PEN_INJECTOR | Freq: Every day | SUBCUTANEOUS | 2 refills | Status: DC
  Filled 2023-10-17: qty 15, 150d supply, fill #0

## 2023-10-17 MED ORDER — INSULIN PEN NEEDLE 31G X 5 MM MISC
1.0000 | Freq: Every day | 2 refills | Status: AC
  Filled 2023-10-17: qty 100, 100d supply, fill #0
  Filled 2024-01-23: qty 100, 100d supply, fill #1

## 2023-10-17 NOTE — Patient Instructions (Signed)
 Recuento de carbohidratos para la diabetes mellitus en los adultos Carbohydrate Counting for Diabetes Mellitus, Adult El recuento de carbohidratos es un mtodo para llevar un registro de la cantidad de carbohidratos que se ingieren. La ingesta de carbohidratos aumenta la cantidad de azcar (glucosa) en la sangre. El recuento de la cantidad de carbohidratos que ingiere mejora el control de su glucemia. Esto, a su vez, le ayuda a controlar su diabetes. Los carbohidratos se miden en gramos (g) por porcin. Es importante saber la cantidad de carbohidratos (en gramos o por tamao de porcin) que se puede ingerir en cada comida. Esto es Government social research officer. Un nutricionista puede ayudarlo a crear un plan de alimentacin y a calcular la cantidad de carbohidratos que debe ingerir en cada comida y colacin. Qu alimentos contienen carbohidratos? Los siguientes alimentos incluyen carbohidratos: Granos, como panes y cereales. Frijoles secos y productos con soja. Verduras con almidn, como papas, guisantes y maz. Brianna Church y jugos de frutas. Leche y Dentist. Dulces y colaciones, como pasteles, galletas, caramelos, papas fritas de bolsa y refrescos. Cmo se calculan los carbohidratos de los alimentos? Hay dos maneras de calcular los carbohidratos de los alimentos. Puede leer las etiquetas de los alimentos o aprender cules son los tamaos de las porciones estndar de los alimentos. Puede usar cualquiera de estos mtodos o una combinacin de [place]. Usar la Air cabin crew de informacin nutricional La lista de Informacin nutricional est incluida en las etiquetas de casi todas las bebidas y los alimentos envasados de los [place]. Esto incluye lo siguiente: El tamao de la porcin. Informacin sobre los nutrientes de cada porcin, incluidos los gramos de carbohidratos por porcin. Para usar la Informacin nutricional, decida cuntas porciones tomar. Luego, multiplique el nmero de porciones por la cantidad  de carbohidratos por porcin. El nmero resultante es la cantidad total de carbohidratos que comer. Conocer los tamaos de las porciones estndar de los alimentos Cuando coma alimentos que contengan carbohidratos y que no estn envasados o no incluyan la informacin nutricional en la etiqueta, debe medir las porciones para poder calcular los gramos de carbohidratos. Mida los alimentos que comer con una balanza de alimentos o una taza medidora, si es necesario. Decida cuntas porciones de Programmer, systems. Multiplique el nmero de porciones por 15. En los alimentos que contienen carbohidratos, una porcin Bow a 15 g de carbohidratos. Por ejemplo, si come 2 tazas o 10 onzas (300 g) de fresas, habr comido 2 porciones y 30 g de carbohidratos (2 porciones x 15 g = 30 g). En el caso de las comidas que contienen mezclas de ms de un alimento, como las sopas y los guisos, debe calcular los carbohidratos de cada alimento que se incluye. La siguiente lista contiene los tamaos de porciones estndar de los alimentos ricos en carbohidratos ms comunes. Cada una de estas porciones tiene aproximadamente 15 g de carbohidratos: 1 rebanada de pan. 1 tortilla de seis pulgadas (15 cm). ? de taza o 2 onzas (53 g) de arroz o pasta cocidos.  taza o 3 onzas (85 g) de lentejas o frijoles cocidos o enlatados, escurridos y enjuagados.  taza o 3 onzas (85 g) de verduras con almidn, como guisantes, maz o zapallo.  taza o 4 onzas (120 g) de cereal caliente.  taza o 3 onzas (85 g) de papas hervidas o en pur, o  o 3 onzas (85 g) de una papa grande al horno.  taza o 4 onzas fluidas (118 ml) de jugo de frutas. 1 taza u  8 onzas fluidas (237 ml) de leche. 1 unidad pequea o 4 onzas (106 g) de manzana.  unidad o 2 onzas (63 g) de una banana mediana. 1 taza o 5 oz (150 g) de fresas. 3 tazas o 1 oz (28.3 g) de palomitas de maz. Cul sera un ejemplo de recuento de carbohidratos? Para calcular los gramos  de carbohidratos de este ejemplo de comida, siga los pasos que se describen a continuacin. Ejemplo de comida 3 onzas (85 g) de pechugas de pollo. ? de taza o 4 onzas (106 g) de arroz integral.  taza o 3 onzas (85 g) de maz. 1 taza u 8 onzas fluidas (237 ml) de leche. 1 taza o 5 onzas (150 g) de fresas con crema batida sin azcar. Clculo de carbohidratos Identifique los alimentos que contienen carbohidratos: Arroz. Maz. Leche. Brianna Church. Calcule cuntas porciones come de cada alimento: 2 porciones de arroz. 1 porcin de maz. 1 porcin de leche. 1 porcin de fresas. Multiplique cada nmero de porciones por 15 g: 2 porciones de arroz x 15 g = 30 g. 1 porcin de maz x 15 g = 15 g. 1 porcin de leche x 15 g = 15 g. 1 porcin de fresas x 15 g = 15 g. Sume todas las cantidades para conocer el total de gramos de carbohidratos consumidos: 30 g + 15 g + 15 g + 15 g = 75 g de carbohidratos en total. Consejos para seguir este plan Al ir de compras [place]ore un plan de comidas y luego haga una lista de compras. Compre verduras frescas y congeladas, frutas frescas y congeladas, productos lcteos, huevos, frijoles, lentejas y cereales integrales. Fjese en las etiquetas de los alimentos. Elija los alimentos que tengan ms fibra y Neurosurgeon. Evite los alimentos procesados y los alimentos con Biochemist, clinical. Planificacin de las comidas Trate de consumir la misma cantidad de gramos de carbohidratos en cada comida y en cada colacin. Planifique tomar comidas y colaciones regulares y equilibradas. Dnde buscar ms informacin American Diabetes Association (Asociacin Estadounidense de la Diabetes): diabetes.org Centers for Disease Control and Prevention (Centros para el Control y la Prevencin de [place]): TonerPromos.no Academy of Nutrition and Dietetics (Academia de Nutricin y Pension scheme manager): Theme [email or web address].org Association of Diabetes Care & Education Specialists (Asociacin de Especialistas en  Atencin y Educacin sobre la Diabetes): diabeteseducator.org Resumen El recuento de carbohidratos es un mtodo para llevar un registro de la cantidad de carbohidratos que se ingieren. La ingesta de carbohidratos aumenta la cantidad de azcar (glucosa) en la sangre. El recuento de la cantidad de carbohidratos que ingiere mejora el control de su glucemia. Esto le ayuda a Chief Operating Officer su diabetes. Un nutricionista puede ayudarlo a crear un plan de alimentacin y a calcular la cantidad de carbohidratos que debe ingerir en cada comida y colacin. Esta informacin no tiene Theme park manager el consejo del mdico. Asegrese de hacerle al mdico cualquier pregunta que tenga. Document Revised: 07/21/2020 Document Reviewed: 07/21/2020 Elsevier Patient Education  2024 [employer].

## 2023-10-17 NOTE — Progress Notes (Signed)
Established Patient Office Visit  Subjective   Patient ID: Brianna Church, female    DOB: 09-03-1946  Age: 77 y.o. MRN: [id number]  No chief complaint on file.   HPI Brianna Church is a 77 y/o female who has history of type 2 diabetes, hyperlipidemia, hypertension,presents for follow up visit. . The [employer] Spanish interpreter was used. She was last seen at the clinic last [place] She reports that she's out of her medications. Her Hgb1c checked during visit  was 11.5% her blood glucose was 435 mg/dl. She endorses polyuria, denies peripheral neuropathy. She c/o intermittent pain to right ankle s/p injury while visiting [place]. She states that she was diagnosed with Glaucoma and cataract while in [place], endorsing intermittent blurry vision, and has not seen an Ophthalmologist. Overall, she states that she's doing well and offers no further complaint.   Patient Active Problem List   Diagnosis Date Noted   Health care maintenance 10/17/2023   Joint pain 10/17/2023   Blurry vision, bilateral 10/17/2023   Encounter to establish care 09/20/2022   Hyperglycemia 08/17/2022   Hyperglycemia due to type 2 diabetes mellitus (HCC) 07/27/2022   AKI (acute kidney injury) (HCC) 07/27/2022   UTI (urinary tract infection) 07/27/2022   Thrombocytopenia (HCC) 07/27/2022   Hyperlipidemia associated with type 2 diabetes mellitus (HCC) 07/27/2022   URTI (acute upper respiratory infection) 07/27/2022   Headache 07/27/2022   Uncontrolled diabetes mellitus with hyperglycemia (HCC) 07/27/2022   Hyponatremia    Diabetes (HCC) 10/27/2015   Hypertension associated with diabetes (HCC) 10/27/2015   GERD (gastroesophageal reflux disease) 10/27/2015   Shoulder pain 07/28/2015   Past Medical History:  Diagnosis Date   Diabetes mellitus without complication (HCC)    GERD (gastroesophageal reflux disease)    Hypertension    Past Surgical History:  Procedure Laterality  Date   ABDOMINAL HYSTERECTOMY     CHOLECYSTECTOMY     Social History   Tobacco Use   Smoking status: Never   Smokeless tobacco: Never  Vaping Use   Vaping status: Never Used  Substance Use Topics   Alcohol use: No   Drug use: No   Family History  Problem Relation Age of Onset   Alzheimer's disease Mother    Prostate cancer Father    Cancer Sister        "gallbladder"   Stomach cancer Brother    Other Maternal Grandmother        unknown medical history   Other Maternal Grandfather        unknown medical history   Other Paternal Grandmother        unknown medical history   Other Paternal Grandfather        unknown medical history   Diabetes Half-Brother    No Known Allergies    Review of Systems  Constitutional: Negative.   HENT: Negative.    Eyes: Negative.   Respiratory: Negative.    Cardiovascular: Negative.   Gastrointestinal: Negative.   Genitourinary: Negative.   Musculoskeletal:  Positive for joint pain (right ankle pain s/p injury last year in [place]).  Skin: Negative.  Negative for rash.  Neurological: Negative.   Endo/Heme/Allergies:  Positive for polydipsia.  Psychiatric/Behavioral: Negative.        Objective:     BP (!) 146/67 (BP Location: Right Arm, Patient Position: Sitting, Cuff Size: Large)   Pulse 74   Resp 16   Wt 131 lb 14.4 oz (59.8 kg)   BMI 23.37  kg/m  BP Readings from Last 3 Encounters:  10/17/23 (!) 146/67  09/27/22 (!) 182/88  09/21/22 (!) 165/78   Wt Readings from Last 3 Encounters:  10/17/23 131 lb 14.4 oz (59.8 kg)  09/27/22 120 lb 14.4 oz (54.8 kg)  09/21/22 123 lb 1.6 oz (55.8 kg)      Physical Exam HENT:     Head: Normocephalic and atraumatic.     Nose: Nose normal.     Mouth/Throat:     Mouth: Mucous membranes are moist.  Eyes:     Extraocular Movements: Extraocular movements intact.     Conjunctiva/sclera: Conjunctivae normal.     Pupils: Pupils are equal, round, and reactive to light.  Cardiovascular:      Rate and Rhythm: Normal rate and regular rhythm.     Pulses: Normal pulses.     Heart sounds: Normal heart sounds.  Pulmonary:     Effort: Pulmonary effort is normal.     Breath sounds: Normal breath sounds.  Abdominal:     General: Bowel sounds are normal.     Palpations: Abdomen is soft.  Musculoskeletal:        General: Normal range of motion.     Cervical back: Normal range of motion.  Skin:    General: Skin is warm.  Neurological:     General: No focal deficit present.     Mental Status: She is alert and oriented to person, place, and time. Mental status is at baseline.  Psychiatric:        Mood and Affect: Mood normal.        Behavior: Behavior normal.        Thought Content: Thought content normal.        Judgment: Judgment normal.      Results for orders placed or performed in visit on 10/17/23  POCT Glucose (CBG)  Result Value Ref Range   POC Glucose 435 (A) 70 - 99 mg/dl  POCT HgB I3K  Result Value Ref Range   Hemoglobin A1C 11.5 (A) 4.0 - 5.6 %   HbA1c POC (<> result, manual entry)     HbA1c, POC (prediabetic range)     HbA1c, POC (controlled diabetic range)      Last CBC Lab Results  Component Value Date   WBC 3.8 (L) 08/18/2022   HGB 12.1 08/18/2022   HCT 38.6 08/18/2022   MCV 87.1 08/18/2022   MCH 27.3 08/18/2022   RDW 13.2 08/18/2022   PLT 110 (L) 08/18/2022   Last metabolic panel Lab Results  Component Value Date   GLUCOSE 243 (H) 09/21/2022   NA 138 09/21/2022   K 4.0 09/21/2022   CL 102 09/21/2022   CO2 24 09/21/2022   BUN 14 09/21/2022   CREATININE 0.98 09/21/2022   EGFR 61 09/21/2022   CALCIUM 8.5 (L) 09/21/2022   PHOS 2.9 07/28/2022   PROT 7.3 08/18/2022   ALBUMIN 3.7 08/18/2022   LABGLOB 2.7 02/01/2018   AGRATIO 1.5 02/01/2018   BILITOT 1.0 08/18/2022   ALKPHOS 87 08/18/2022   AST 30 08/18/2022   ALT 17 08/18/2022   ANIONGAP 8 08/18/2022   Last lipids Lab Results  Component Value Date   CHOL 134 09/21/2022   HDL 51  09/21/2022   LDLCALC 62 09/21/2022   TRIG 114 09/21/2022   CHOLHDL 2.6 09/21/2022   Last hemoglobin A1c Lab Results  Component Value Date   HGBA1C 11.5 (A) 10/17/2023   Last thyroid functions Lab Results  Component Value  Date   TSH 1.950 02/01/2018   Last vitamin D No results found for: "25OHVITD2", "25OHVITD3", "VD25OH" Last vitamin B12 and Folate No results found for: "VITAMINB12", "FOLATE"    The 10-year ASCVD risk score (Arnett DK, et al., 2019) is: 43.7%    Assessment & Plan:   1. Type 2 diabetes mellitus with hyperglycemia, with long-term current use of insulin (HCC) - Her HgbA1c was 11.5%, her goal should be less than 8%. She was started on Basaglar 10 units at bedtime, check blood glucose bid, record and bring log to follow up appointment. Advised that fasting blood glucose reading should between 80-130 mg/dl. She was encouraged to continue on low carb/non concentrated sweet diet. - POCT HgB A1C; Future - POCT Glucose (CBG); Future - Insulin Glargine (BASAGLAR KWIKPEN) 100 UNIT/ML; Inject 10 Units into the skin at bedtime.  Dispense: 15 mL; Refill: 2 - Insulin Pen Needle 31G X 5 MM MISC; Use at bedtime.  Dispense: 100 each; Refill: 2 - blood glucose meter kit and supplies KIT; Dispense based on patient and insurance preference. Use up to four times daily as directed.  Dispense: 1 each; Refill: 0 - POCT Glucose (CBG) - POCT HgB A1C  2. Health care maintenance - Routine labs will be checked. - Lipid panel; Future - Comp Met (CMET); Future - CBC w/Diff; Future - Urine Microalbumin w/creat. ratio; Future - UA/M w/rflx Culture, Routine; Future  3. Hypertension associated with diabetes (HCC) - She was started on Avapro, educated on medication side effects and to notify clinic. She was encouraged to continue on DASH diet. - irbesartan (AVAPRO) 75 MG tablet; Take 1 tablet (75 mg total) by mouth daily.  Dispense: 40 tablet; Refill: 0  4. Arthralgia of right ankle - She  was advised to take 650 mg Tylenol every 12 hours as needed for pain, and will follow up with Dr Gavin Potters. She was advised to go to the ED for worsening symptoms.  5. Blurry vision, bilateral - She was advised to complete [facility] care application for  - Ambulatory referral to Ophthalmology to [facility].    Return in about 1 week (around 10/24/2023), or if symptoms worsen or fail to improve.    Quamere Mussell Trellis Paganini, NP

## 2023-10-18 ENCOUNTER — Other Ambulatory Visit: Payer: Self-pay

## 2023-10-18 DIAGNOSIS — Z0000 Encounter for general adult medical examination without abnormal findings: Secondary | ICD-10-CM

## 2023-10-19 ENCOUNTER — Ambulatory Visit: Payer: Self-pay | Admitting: Gerontology

## 2023-10-21 LAB — UA/M W/RFLX CULTURE, ROUTINE
Bilirubin, UA: NEGATIVE
Glucose, UA: NEGATIVE
Ketones, UA: NEGATIVE
Nitrite, UA: POSITIVE — AB
Specific Gravity, UA: 1.014 (ref 1.005–1.030)
Urobilinogen, Ur: 0.2 mg/dL (ref 0.2–1.0)
pH, UA: 5 (ref 5.0–7.5)

## 2023-10-21 LAB — COMPREHENSIVE METABOLIC PANEL
ALT: 20 [IU]/L (ref 0–32)
AST: 25 [IU]/L (ref 0–40)
Albumin: 4.1 g/dL (ref 3.8–4.8)
Alkaline Phosphatase: 117 [IU]/L (ref 44–121)
BUN/Creatinine Ratio: 14 (ref 12–28)
BUN: 18 mg/dL (ref 8–27)
Bilirubin Total: 0.7 mg/dL (ref 0.0–1.2)
CO2: 21 mmol/L (ref 20–29)
Calcium: 8.6 mg/dL — ABNORMAL LOW (ref 8.7–10.3)
Chloride: 101 mmol/L (ref 96–106)
Creatinine, Ser: 1.28 mg/dL — ABNORMAL HIGH (ref 0.57–1.00)
Globulin, Total: 3 g/dL (ref 1.5–4.5)
Glucose: 281 mg/dL — ABNORMAL HIGH (ref 70–99)
Potassium: 4.3 mmol/L (ref 3.5–5.2)
Sodium: 138 mmol/L (ref 134–144)
Total Protein: 7.1 g/dL (ref 6.0–8.5)
eGFR: 44 mL/min/{1.73_m2} — ABNORMAL LOW (ref >59)

## 2023-10-21 LAB — CBC WITH DIFFERENTIAL/PLATELET
Basophils Absolute: 0 10*3/uL (ref 0.0–0.2)
Basos: 1 %
EOS (ABSOLUTE): 0.2 10*3/uL (ref 0.0–0.4)
Eos: 4 %
Hematocrit: 40.8 % (ref 34.0–46.6)
Hemoglobin: 12.9 g/dL (ref 11.1–15.9)
Immature Grans (Abs): 0 10*3/uL (ref 0.0–0.1)
Immature Granulocytes: 1 %
Lymphocytes Absolute: 1.9 10*3/uL (ref 0.7–3.1)
Lymphs: 34 %
MCH: 28.7 pg (ref 26.6–33.0)
MCHC: 31.6 g/dL (ref 31.5–35.7)
MCV: 91 fL (ref 79–97)
Monocytes Absolute: 0.6 10*3/uL (ref 0.1–0.9)
Monocytes: 10 %
Neutrophils Absolute: 3 10*3/uL (ref 1.4–7.0)
Neutrophils: 50 %
Platelets: 91 10*3/uL — CL (ref 150–450)
RBC: 4.49 x10E6/uL (ref 3.77–5.28)
RDW: 13.3 % (ref 11.7–15.4)
WBC: 5.8 10*3/uL (ref 3.4–10.8)

## 2023-10-21 LAB — MICROSCOPIC EXAMINATION
Casts: NONE SEEN /LPF
RBC, Urine: NONE SEEN /[HPF] (ref 0–2)
WBC, UA: >30 /[HPF] — AB (ref 0–5)

## 2023-10-21 LAB — LIPID PANEL
Chol/HDL Ratio: 2.1 {ratio} (ref 0.0–4.4)
Cholesterol, Total: 107 mg/dL (ref 100–199)
HDL: 50 mg/dL (ref >39)
LDL Chol Calc (NIH): 34 mg/dL (ref 0–99)
Triglycerides: 129 mg/dL (ref 0–149)
VLDL Cholesterol Cal: 23 mg/dL (ref 5–40)

## 2023-10-21 LAB — MICROALBUMIN / CREATININE URINE RATIO
Creatinine, Urine: 105.3 mg/dL
Microalb/Creat Ratio: 1043 mg/g{creat} — ABNORMAL HIGH (ref 0–29)
Microalbumin, Urine: 1098.7 ug/mL

## 2023-10-21 LAB — URINE CULTURE, REFLEX

## 2023-10-24 ENCOUNTER — Ambulatory Visit: Payer: Self-pay | Admitting: Gerontology

## 2023-10-24 ENCOUNTER — Other Ambulatory Visit: Payer: Self-pay

## 2023-10-24 ENCOUNTER — Other Ambulatory Visit: Payer: Self-pay | Admitting: Gerontology

## 2023-10-24 DIAGNOSIS — N390 Urinary tract infection, site not specified: Secondary | ICD-10-CM

## 2023-10-24 MED ORDER — NITROFURANTOIN MONOHYD MACRO 100 MG PO CAPS
100.0000 mg | ORAL_CAPSULE | Freq: Two times a day (BID) | ORAL | 0 refills | Status: DC
  Filled 2023-10-24: qty 14, 7d supply, fill #0

## 2023-10-26 ENCOUNTER — Other Ambulatory Visit: Payer: Self-pay

## 2023-10-26 ENCOUNTER — Encounter: Payer: Self-pay | Admitting: Gerontology

## 2023-10-26 ENCOUNTER — Ambulatory Visit: Payer: Self-pay | Admitting: Gerontology

## 2023-10-26 ENCOUNTER — Other Ambulatory Visit (HOSPITAL_COMMUNITY): Payer: Self-pay

## 2023-10-26 VITALS — BP 153/79 | HR 69 | Temp 97.9°F | Resp 16 | Wt 132.5 lb

## 2023-10-26 DIAGNOSIS — N289 Disorder of kidney and ureter, unspecified: Secondary | ICD-10-CM | POA: Insufficient documentation

## 2023-10-26 DIAGNOSIS — D696 Thrombocytopenia, unspecified: Secondary | ICD-10-CM

## 2023-10-26 DIAGNOSIS — E785 Hyperlipidemia, unspecified: Secondary | ICD-10-CM

## 2023-10-26 DIAGNOSIS — N390 Urinary tract infection, site not specified: Secondary | ICD-10-CM

## 2023-10-26 DIAGNOSIS — G4700 Insomnia, unspecified: Secondary | ICD-10-CM

## 2023-10-26 DIAGNOSIS — E1165 Type 2 diabetes mellitus with hyperglycemia: Secondary | ICD-10-CM

## 2023-10-26 MED ORDER — PRAVASTATIN SODIUM 40 MG PO TABS
40.0000 mg | ORAL_TABLET | Freq: Every day | ORAL | 0 refills | Status: DC
  Filled 2023-10-26: qty 30, 30d supply, fill #0

## 2023-10-26 MED ORDER — BASAGLAR KWIKPEN 100 UNIT/ML ~~LOC~~ SOPN
13.0000 [IU] | PEN_INJECTOR | Freq: Every day | SUBCUTANEOUS | 2 refills | Status: DC
  Filled 2023-10-26: qty 15, 115d supply, fill #0

## 2023-10-26 MED ORDER — METFORMIN HCL ER 500 MG PO TB24
500.0000 mg | ORAL_TABLET | Freq: Every day | ORAL | 0 refills | Status: DC
  Filled 2023-10-26: qty 30, 30d supply, fill #0

## 2023-10-26 MED ORDER — MELATONIN 3-10 MG PO TABS
6.0000 mg | ORAL_TABLET | Freq: Every day | ORAL | 0 refills | Status: DC
  Filled 2023-10-26: qty 60, fill #0

## 2023-10-26 NOTE — Progress Notes (Signed)
Established Patient Office Visit  Subjective   Patient ID: Brianna Church, female    DOB: Apr 07, 1946  Age: 77 y.o. MRN: [id number]  No chief complaint on file.  [employer] interpreter [phone] was used during visit HPI  Brianna Church is a 77 y/o female who has history of type 2 diabetes, hyperlipidemia, hypertension,presents for follow up visit and lab review. Her HgbA1c checked on 10/18/23 was 11.5%. She states that she  checks her blood glucose between 1-2 pm and her readings are usually in the 300's. Her blood glucose was 439 mg/dl during visit. She denies hypo/hyperglycemic symptoms, peripheral neuropathy and performs daily foot checks. She states that she's compliant with her Nitrofurantoin for Urinary tract infection, she denies side effects, dysuria, hematuria, flank and pelvic pain. She was also notified about her elevated Serum creatinine of 1.28 mg/dl and eGFR of 44. Also her urine/microalbuminuria was elevated at 1,043. Her platelet count was  91, she denies hematuria, hematochezia and active bleeding.  She also c/o difficulty falling and staying asleep. Overall, she states that she's doing well and offers no further complaint.  Review of Systems  Constitutional: Negative.   Eyes: Negative.   Respiratory: Negative.    Cardiovascular: Negative.   Gastrointestinal: Negative.   Genitourinary: Negative.   Skin: Negative.   Neurological: Negative.   Endo/Heme/Allergies: Negative.   Psychiatric/Behavioral: Negative.        Objective:     BP (!) 153/79 (BP Location: Left Arm, Patient Position: Sitting, Cuff Size: Large)   Pulse 69   Temp 97.9 F (36.6 C)   Resp 16   Wt 132 lb 8 oz (60.1 kg)   BMI 23.47 kg/m  BP Readings from Last 3 Encounters:  10/26/23 (!) 153/79  10/17/23 (!) 146/67  09/27/22 (!) 182/88   Wt Readings from Last 3 Encounters:  10/26/23 132 lb 8 oz (60.1 kg)  10/17/23 131 lb 14.4 oz (59.8 kg)  09/27/22 120 lb 14.4 oz (54.8 kg)      Physical  Exam HENT:     Head: Normocephalic and atraumatic.     Mouth/Throat:     Mouth: Mucous membranes are moist.  Eyes:     Pupils: Pupils are equal, round, and reactive to light.  Cardiovascular:     Rate and Rhythm: Normal rate and regular rhythm.     Pulses: Normal pulses.     Heart sounds: Normal heart sounds.  Pulmonary:     Effort: Pulmonary effort is normal.     Breath sounds: Normal breath sounds.  Skin:    General: Skin is warm.  Neurological:     General: No focal deficit present.     Mental Status: She is alert and oriented to person, place, and time.      No results found for any visits on 10/26/23.  Last CBC Lab Results  Component Value Date   WBC 5.8 10/18/2023   HGB 12.9 10/18/2023   HCT 40.8 10/18/2023   MCV 91 10/18/2023   MCH 28.7 10/18/2023   RDW 13.3 10/18/2023   PLT 91 (LL) 10/18/2023   Last metabolic panel Lab Results  Component Value Date   GLUCOSE 281 (H) 10/18/2023   NA 138 10/18/2023   K 4.3 10/18/2023   CL 101 10/18/2023   CO2 21 10/18/2023   BUN 18 10/18/2023   CREATININE 1.28 (H) 10/18/2023   EGFR 44 (L) 10/18/2023   CALCIUM 8.6 (L) 10/18/2023   PHOS 2.9 07/28/2022   PROT 7.1 10/18/2023  ALBUMIN 4.1 10/18/2023   LABGLOB 3.0 10/18/2023   AGRATIO 1.5 02/01/2018   BILITOT 0.7 10/18/2023   ALKPHOS 117 10/18/2023   AST 25 10/18/2023   ALT 20 10/18/2023   ANIONGAP 8 08/18/2022   Last lipids Lab Results  Component Value Date   CHOL 107 10/18/2023   HDL 50 10/18/2023   LDLCALC 34 10/18/2023   TRIG 129 10/18/2023   CHOLHDL 2.1 10/18/2023   Last hemoglobin A1c Lab Results  Component Value Date   HGBA1C 11.5 (A) 10/17/2023   Last vitamin D No results found for: "25OHVITD2", "25OHVITD3", "VD25OH" Last vitamin B12 and Folate No results found for: "VITAMINB12", "FOLATE"    The ASCVD Risk score (Arnett DK, et al., 2019) failed to calculate for the following reasons:   The valid total cholesterol range is 130 to 320 mg/dL     Assessment & Plan:   1. Type 2 diabetes mellitus with hyperglycemia, with long-term current use of insulin (HCC) (Primary) - Her diabetes is not controlled, her HgbA1c goal should be less than 8%. She was encouraged to check her blood glucose at least bid, fasting should be between 80-130 mg/dl, record and bring log to appointment. She was  started on Metformin 500 mg daily, educated on side effects and advised to notify clinic. Her glargine was increased to 13 units at bedtime. She was encouraged to continue on low carb/non concentrated sweet diet. - POCT Glucose (CBG); Future - metFORMIN (GLUCOPHAGE-XR) 500 MG 24 hr tablet; Take 1 tablet (500 mg total) by mouth daily with breakfast.  Dispense: 30 tablet; Refill: 0 - Insulin Glargine (BASAGLAR KWIKPEN) 100 UNIT/ML; Inject 13 Units into the skin at bedtime.  Dispense: 15 mL; Refill: 2  2. Hyperlipidemia associated with type 2 diabetes mellitus (HCC) - She was started on Pravastatin, advised to continue on low fat/cholesterol diet.  - pravastatin (PRAVACHOL) 40 MG tablet; Take 1 tablet (40 mg total) by mouth daily.  Dispense: 30 tablet; Refill: 0  3. Thrombocytopenia (HCC) - Her platelet was 91, was advised to go to the ED for hematuria, hematochezia and active bleeding.  4. Urinary tract infection without hematuria, site unspecified - She was advised to complete antibiotic medication, increase water intake and proper perineal hygiene. - UA/M w/rflx Culture, Routine; Future  5. Insomnia, unspecified type - She was started on Melatonin and advised to practice sleep hygiene. - Melatonin 3-10 MG TABS; Take 6 mg by mouth at bedtime.  Dispense: 60 tablet; Refill: 0   6. Abnormal renal function - She was advised to increase water intake, and tighter glycemic control. Will recheck her renal function.   Return in about 4 weeks (around 11/23/2023), or if symptoms worsen or fail to improve.    Jafar Poffenberger Trellis Paganini, NP

## 2023-10-26 NOTE — Patient Instructions (Signed)
 Recuento de carbohidratos para la diabetes mellitus en los adultos Carbohydrate Counting for Diabetes Mellitus, Adult El recuento de carbohidratos es un mtodo para llevar un registro de la cantidad de carbohidratos que se ingieren. La ingesta de carbohidratos aumenta la cantidad de azcar (glucosa) en la sangre. El recuento de la cantidad de carbohidratos que ingiere mejora el control de su glucemia. Esto, a su vez, le ayuda a controlar su diabetes. Los carbohidratos se miden en gramos (g) por porcin. Es importante saber la cantidad de carbohidratos (en gramos o por tamao de porcin) que se puede ingerir en cada comida. Esto es Government social research officer. Un nutricionista puede ayudarlo a crear un plan de alimentacin y a calcular la cantidad de carbohidratos que debe ingerir en cada comida y colacin. Qu alimentos contienen carbohidratos? Los siguientes alimentos incluyen carbohidratos: Granos, como panes y cereales. Frijoles secos y productos con soja. Verduras con almidn, como papas, guisantes y maz. Nils Pyle y jugos de frutas. Leche y Dentist. Dulces y colaciones, como pasteles, galletas, caramelos, papas fritas de bolsa y refrescos. Cmo se calculan los carbohidratos de los alimentos? Hay dos maneras de calcular los carbohidratos de los alimentos. Puede leer las etiquetas de los alimentos o aprender cules son los tamaos de las porciones estndar de los alimentos. Puede usar cualquiera de estos mtodos o una combinacin de [place]. Usar la Air cabin crew de informacin nutricional La lista de Informacin nutricional est incluida en las etiquetas de casi todas las bebidas y los alimentos envasados de los [place]. Esto incluye lo siguiente: El tamao de la porcin. Informacin sobre los nutrientes de cada porcin, incluidos los gramos de carbohidratos por porcin. Para usar la Informacin nutricional, decida cuntas porciones tomar. Luego, multiplique el nmero de porciones por la cantidad  de carbohidratos por porcin. El nmero resultante es la cantidad total de carbohidratos que comer. Conocer los tamaos de las porciones estndar de los alimentos Cuando coma alimentos que contengan carbohidratos y que no estn envasados o no incluyan la informacin nutricional en la etiqueta, debe medir las porciones para poder calcular los gramos de carbohidratos. Mida los alimentos que comer con una balanza de alimentos o una taza medidora, si es necesario. Decida cuntas porciones de Programmer, systems. Multiplique el nmero de porciones por 15. En los alimentos que contienen carbohidratos, una porcin [place] a 15 g de carbohidratos. Por ejemplo, si come 2 tazas o 10 onzas (300 g) de fresas, habr comido 2 porciones y 30 g de carbohidratos (2 porciones x 15 g = 30 g). En el caso de las comidas que contienen mezclas de ms de un alimento, como las sopas y los guisos, debe calcular los carbohidratos de cada alimento que se incluye. La siguiente lista contiene los tamaos de porciones estndar de los alimentos ricos en carbohidratos ms comunes. Cada una de estas porciones tiene aproximadamente 15 g de carbohidratos: 1 rebanada de pan. 1 tortilla de seis pulgadas (15 cm). ? de taza o 2 onzas (53 g) de arroz o pasta cocidos.  taza o 3 onzas (85 g) de lentejas o frijoles cocidos o enlatados, escurridos y enjuagados.  taza o 3 onzas (85 g) de verduras con almidn, como guisantes, maz o zapallo.  taza o 4 onzas (120 g) de cereal caliente.  taza o 3 onzas (85 g) de papas hervidas o en pur, o  o 3 onzas (85 g) de una papa grande al horno.  taza o 4 onzas fluidas (118 ml) de jugo de frutas. 1 taza u  8 onzas fluidas (237 ml) de leche. 1 unidad pequea o 4 onzas (106 g) de manzana.  unidad o 2 onzas (63 g) de una banana mediana. 1 taza o 5 oz (150 g) de fresas. 3 tazas o 1 oz (28.3 g) de palomitas de maz. Cul sera un ejemplo de recuento de carbohidratos? Para calcular los gramos  de carbohidratos de este ejemplo de comida, siga los pasos que se describen a continuacin. Ejemplo de comida 3 onzas (85 g) de pechugas de pollo. ? de taza o 4 onzas (106 g) de arroz integral.  taza o 3 onzas (85 g) de maz. 1 taza u 8 onzas fluidas (237 ml) de leche. 1 taza o 5 onzas (150 g) de fresas con crema batida sin azcar. Clculo de carbohidratos Identifique los alimentos que contienen carbohidratos: Arroz. Maz. Leche. Jinny Sanders. Calcule cuntas porciones come de cada alimento: 2 porciones de arroz. 1 porcin de maz. 1 porcin de leche. 1 porcin de fresas. Multiplique cada nmero de porciones por 15 g: 2 porciones de arroz x 15 g = 30 g. 1 porcin de maz x 15 g = 15 g. 1 porcin de leche x 15 g = 15 g. 1 porcin de fresas x 15 g = 15 g. Sume todas las cantidades para conocer el total de gramos de carbohidratos consumidos: 30 g + 15 g + 15 g + 15 g = 75 g de carbohidratos en total. Consejos para seguir este plan Al ir de compras [place] un plan de comidas y luego haga una lista de compras. Compre verduras frescas y congeladas, frutas frescas y congeladas, productos lcteos, huevos, frijoles, lentejas y cereales integrales. Fjese en las etiquetas de los alimentos. Elija los alimentos que tengan ms fibra y Neurosurgeon. Evite los alimentos procesados y los alimentos con Biochemist, clinical. Planificacin de las comidas Trate de consumir la misma cantidad de gramos de carbohidratos en cada comida y en cada colacin. Planifique tomar comidas y colaciones regulares y equilibradas. Dnde buscar ms informacin American Diabetes Association (Asociacin Estadounidense de la Diabetes): diabetes.org Centers for Disease Control and Prevention (Centros para el Control y la Prevencin de [place]): [email or web address].no Academy of Nutrition and Dietetics (Academia de Nutricin y Pension scheme manager): Theme park [email or web address].org Association of Diabetes Care & Education Specialists (Asociacin de Especialistas en  Atencin y Educacin sobre la Diabetes): [email or web address] Resumen El recuento de carbohidratos es un mtodo para llevar un registro de la cantidad de carbohidratos que se ingieren. La ingesta de carbohidratos aumenta la cantidad de azcar (glucosa) en la sangre. El recuento de la cantidad de carbohidratos que ingiere mejora el control de su glucemia. Esto le ayuda a Chief Operating Officer su diabetes. Un nutricionista puede ayudarlo a crear un plan de alimentacin y a calcular la cantidad de carbohidratos que debe ingerir en cada comida y colacin. Esta informacin no tiene Theme park manager el consejo del mdico. Asegrese de hacerle al mdico cualquier pregunta que tenga. Document Revised: 07/21/2020 Document Reviewed: 07/21/2020 Elsevier Patient Education  2024 [employer].

## 2023-10-27 ENCOUNTER — Other Ambulatory Visit: Payer: Self-pay

## 2023-10-30 ENCOUNTER — Other Ambulatory Visit: Payer: Self-pay

## 2023-10-31 ENCOUNTER — Other Ambulatory Visit: Payer: Self-pay

## 2023-10-31 MED ORDER — TRUEPLUS LANCETS 33G MISC
Freq: Four times a day (QID) | 0 refills | Status: AC
  Filled 2023-10-31: qty 100, 25d supply, fill #0

## 2023-10-31 MED ORDER — TRUEDRAW LANCING DEVICE MISC
0 refills | Status: AC
  Filled 2023-10-31: qty 1, 30d supply, fill #0

## 2023-10-31 MED ORDER — BLOOD GLUCOSE TEST VI STRP
ORAL_STRIP | Freq: Four times a day (QID) | 0 refills | Status: AC
  Filled 2023-10-31: qty 100, 25d supply, fill #0

## 2023-11-14 ENCOUNTER — Other Ambulatory Visit: Payer: Self-pay

## 2023-11-22 ENCOUNTER — Other Ambulatory Visit: Payer: Self-pay

## 2023-11-22 DIAGNOSIS — E1165 Type 2 diabetes mellitus with hyperglycemia: Secondary | ICD-10-CM

## 2023-11-22 DIAGNOSIS — N390 Urinary tract infection, site not specified: Secondary | ICD-10-CM

## 2023-11-22 LAB — GLUCOSE, POCT (MANUAL RESULT ENTRY): POC Glucose: 533 mg/dL — AB (ref 70–99)

## 2023-11-23 ENCOUNTER — Encounter: Payer: Self-pay | Admitting: Gerontology

## 2023-11-23 ENCOUNTER — Other Ambulatory Visit: Payer: Self-pay

## 2023-11-23 ENCOUNTER — Ambulatory Visit: Payer: Self-pay | Admitting: Gerontology

## 2023-11-23 VITALS — BP 119/65 | HR 85 | Ht 62.0 in | Wt 129.4 lb

## 2023-11-23 DIAGNOSIS — N390 Urinary tract infection, site not specified: Secondary | ICD-10-CM

## 2023-11-23 DIAGNOSIS — G4700 Insomnia, unspecified: Secondary | ICD-10-CM

## 2023-11-23 DIAGNOSIS — E1159 Type 2 diabetes mellitus with other circulatory complications: Secondary | ICD-10-CM

## 2023-11-23 DIAGNOSIS — E1165 Type 2 diabetes mellitus with hyperglycemia: Secondary | ICD-10-CM

## 2023-11-23 DIAGNOSIS — E1169 Type 2 diabetes mellitus with other specified complication: Secondary | ICD-10-CM

## 2023-11-23 LAB — GLUCOSE, POCT (MANUAL RESULT ENTRY): POC Glucose: 417 mg/dL — AB (ref 70–99)

## 2023-11-23 MED ORDER — IRBESARTAN 75 MG PO TABS
75.0000 mg | ORAL_TABLET | Freq: Every day | ORAL | 0 refills | Status: DC
  Filled 2023-11-23: qty 30, 30d supply, fill #0

## 2023-11-23 MED ORDER — PRAVASTATIN SODIUM 40 MG PO TABS
40.0000 mg | ORAL_TABLET | Freq: Every day | ORAL | 0 refills | Status: DC
  Filled 2023-11-23: qty 30, 30d supply, fill #0

## 2023-11-23 MED ORDER — TRAZODONE HCL 50 MG PO TABS
25.0000 mg | ORAL_TABLET | Freq: Every day | ORAL | 0 refills | Status: DC
  Filled 2023-11-23: qty 15, 30d supply, fill #0

## 2023-11-23 MED ORDER — METFORMIN HCL ER 500 MG PO TB24
500.0000 mg | ORAL_TABLET | Freq: Every day | ORAL | 0 refills | Status: DC
  Filled 2023-11-23: qty 30, 30d supply, fill #0

## 2023-11-23 NOTE — Patient Instructions (Signed)
 Recuento de carbohidratos para la diabetes mellitus en los adultos Carbohydrate Counting for Diabetes Mellitus, Adult El recuento de carbohidratos es un mtodo para llevar un registro de la cantidad de carbohidratos que se ingieren. La ingesta de carbohidratos aumenta la cantidad de azcar (glucosa) en la sangre. El recuento de la cantidad de carbohidratos que ingiere mejora el control de su glucemia. Esto, a su vez, le ayuda a controlar su diabetes. Los carbohidratos se miden en gramos (g) por porcin. Es importante saber la cantidad de carbohidratos (en gramos o por tamao de porcin) que se puede ingerir en cada comida. Esto es Government social research officer. Un nutricionista puede ayudarlo a crear un plan de alimentacin y a calcular la cantidad de carbohidratos que debe ingerir en cada comida y colacin. Qu alimentos contienen carbohidratos? Los siguientes alimentos incluyen carbohidratos: Granos, como panes y cereales. Frijoles secos y productos con soja. Verduras con almidn, como papas, guisantes y maz. Joretta y jugos de frutas. Leche y Dentist. Dulces y colaciones, como pasteles, galletas, caramelos, papas fritas de bolsa y refrescos. Cmo se calculan los carbohidratos de los alimentos? Hay dos maneras de calcular los carbohidratos de los alimentos. Puede leer las etiquetas de los alimentos o aprender cules son los tamaos de las porciones estndar de los alimentos. Puede usar cualquiera de estos mtodos o una combinacin de [place]. Usar la Air cabin crew de informacin nutricional La lista de Informacin nutricional est incluida en las etiquetas de casi todas las bebidas y los alimentos envasados de los [place]. Esto incluye lo siguiente: El tamao de la porcin. Informacin sobre los nutrientes de cada porcin, incluidos los gramos de carbohidratos por porcin. Para usar la Informacin nutricional, decida cuntas porciones tomar. Luego, multiplique el nmero de porciones por la cantidad  de carbohidratos por porcin. El nmero resultante es la cantidad total de carbohidratos que comer. Conocer los tamaos de las porciones estndar de los alimentos Cuando coma alimentos que contengan carbohidratos y que no estn envasados o no incluyan la informacin nutricional en la etiqueta, debe medir las porciones para poder calcular los gramos de carbohidratos. Mida los alimentos que comer con una balanza de alimentos o una taza medidora, si es necesario. Decida cuntas porciones de Programmer, systems. Multiplique el nmero de porciones por 15. En los alimentos que contienen carbohidratos, una porcin [place] a 15 g de carbohidratos. Por ejemplo, si come 2 tazas o 10 onzas (300 g) de fresas, habr comido 2 porciones y 30 g de carbohidratos (2 porciones x 15 g = 30 g). En el caso de las comidas que contienen mezclas de ms de un alimento, como las sopas y los guisos, debe calcular los carbohidratos de cada alimento que se incluye. La siguiente lista contiene los tamaos de porciones estndar de los alimentos ricos en carbohidratos ms comunes. Cada una de estas porciones tiene aproximadamente 15 g de carbohidratos: 1 rebanada de pan. 1 tortilla de seis pulgadas (15 cm). ? de taza o 2 onzas (53 g) de arroz o pasta cocidos.  taza o 3 onzas (85 g) de lentejas o frijoles cocidos o enlatados, escurridos y enjuagados.  taza o 3 onzas (85 g) de verduras con almidn, como guisantes, maz o zapallo.  taza o 4 onzas (120 g) de cereal caliente.  taza o 3 onzas (85 g) de papas hervidas o en pur, o  o 3 onzas (85 g) de una papa grande al horno.  taza o 4 onzas fluidas (118 ml) de jugo de frutas. 1 taza u  8 onzas fluidas (237 ml) de leche. 1 unidad pequea o 4 onzas (106 g) de manzana.  unidad o 2 onzas (63 g) de una banana mediana. 1 taza o 5 oz (150 g) de fresas. 3 tazas o 1 oz (28.3 g) de palomitas de maz. Cul sera un ejemplo de recuento de carbohidratos? Para calcular los gramos  de carbohidratos de este ejemplo de comida, siga los pasos que se describen a continuacin. Ejemplo de comida 3 onzas (85 g) de pechugas de pollo. ? de taza o 4 onzas (106 g) de arroz integral.  taza o 3 onzas (85 g) de maz. 1 taza u 8 onzas fluidas (237 ml) de leche. 1 taza o 5 onzas (150 g) de fresas con crema batida sin azcar. Clculo de carbohidratos Identifique los alimentos que contienen carbohidratos: Arroz. Maz. Leche. Brianna Church. Calcule cuntas porciones come de cada alimento: 2 porciones de arroz. 1 porcin de maz. 1 porcin de leche. 1 porcin de fresas. Multiplique cada nmero de porciones por 15 g: 2 porciones de arroz x 15 g = 30 g. 1 porcin de maz x 15 g = 15 g. 1 porcin de leche x 15 g = 15 g. 1 porcin de fresas x 15 g = 15 g. Sume todas las cantidades para conocer el total de gramos de carbohidratos consumidos: 30 g + 15 g + 15 g + 15 g = 75 g de carbohidratos en total. Consejos para seguir este plan Al ir de compras [place] un plan de comidas y luego haga una lista de compras. Compre verduras frescas y congeladas, frutas frescas y congeladas, productos lcteos, huevos, frijoles, lentejas y cereales integrales. Fjese en las etiquetas de los alimentos. Elija los alimentos que tengan ms fibra y menos azcar. Evite los alimentos procesados y los alimentos con Biochemist, clinical. Planificacin de las comidas Trate de consumir la misma cantidad de gramos de carbohidratos en cada comida y en cada colacin. Planifique tomar comidas y colaciones regulares y equilibradas. Dnde buscar ms informacin American Diabetes Association (Asociacin Estadounidense de la Diabetes): diabetes.org Centers for Disease Control and Prevention (Centros para el Control y la Prevencin de [place]): TonerPromos.no Academy of Nutrition and Dietetics (Academia de Nutricin y Pension scheme manager): Theme park manager.org Association of Diabetes Care & Education Specialists (Asociacin de Especialistas en  Atencin y Educacin sobre la Diabetes): [email or web address] Resumen El recuento de carbohidratos es un mtodo para llevar un registro de la cantidad de carbohidratos que se ingieren. La ingesta de carbohidratos aumenta la cantidad de azcar (glucosa) en la sangre. El recuento de la cantidad de carbohidratos que ingiere mejora el control de su glucemia. Esto le ayuda a Chief Operating Officer su diabetes. Un nutricionista puede ayudarlo a crear un plan de alimentacin y a calcular la cantidad de carbohidratos que debe ingerir en cada comida y colacin. Esta informacin no tiene Theme park manager el consejo del mdico. Asegrese de hacerle al mdico cualquier pregunta que tenga. Document Revised: 07/21/2020 Document Reviewed: 07/21/2020 Elsevier Patient Education  2024 [employer].

## 2023-11-23 NOTE — Progress Notes (Signed)
 Established Patient Office Visit  Subjective   Patient ID: Brianna Church, female    DOB: [birth year]  Age: 78 y.o. MRN: [id number]  Chief Complaint  Patient presents with   Diabetes    HPI Brianna Church is a 78 y/o female who has history of type 2 diabetes, hyperlipidemia, hypertension,presents for follow up visit and lab review. Her HgbA1c checked on 10/18/23 was 11.5%.  She states that she has not checked her blood glucose in 3 weeks because she misplaced her glucometer. Her blood glucose was 417 mg/dl during office visit. She endorses polyuria and polydipsia. Her urinalysis was cloudy, trace Leukocytes , positive Nitrite and 3+ Protein. She denies dysuria, pelvic nor flank pain. She completed the course of Nitrofurantoin  and waiting for urine culture and sensitivity. She also c/o insomnia which is chronic, melatonin not effective and she was taking 25 mg Trazodone  which was effective. Overall, she states that she's doing well and offers no further complaint.     Review of Systems  Constitutional: Negative.   Eyes: Negative.   Respiratory: Negative.    Cardiovascular: Negative.   Genitourinary:  Negative for dysuria, flank pain, frequency and urgency.  Skin: Negative.   Neurological: Negative.   Endo/Heme/Allergies: Negative.   Psychiatric/Behavioral:  The patient has insomnia.       Objective:     BP 119/65 (BP Location: Right Arm, Patient Position: Sitting, Cuff Size: Small)   Pulse 85   Ht 5' 2 (1.575 m)   Wt 129 lb 6.4 oz (58.7 kg)   SpO2 97%   BMI 23.67 kg/m  BP Readings from Last 3 Encounters:  11/23/23 119/65  10/26/23 (!) 153/79  10/17/23 (!) 146/67   Wt Readings from Last 3 Encounters:  11/23/23 129 lb 6.4 oz (58.7 kg)  10/26/23 132 lb 8 oz (60.1 kg)  10/17/23 131 lb 14.4 oz (59.8 kg)      Physical Exam HENT:     Head: Normocephalic and atraumatic.     Mouth/Throat:     Mouth: Mucous membranes are moist.  Eyes:     Pupils: Pupils are  equal, round, and reactive to light.  Cardiovascular:     Rate and Rhythm: Normal rate and regular rhythm.  Abdominal:     General: Bowel sounds are normal.     Palpations: Abdomen is soft.     Tenderness: There is no right CVA tenderness or left CVA tenderness.  Skin:    General: Skin is warm.  Neurological:     General: No focal deficit present.     Mental Status: She is alert and oriented to person, place, and time.  Psychiatric:        Mood and Affect: Mood normal.        Behavior: Behavior normal.        Thought Content: Thought content normal.        Judgment: Judgment normal.      Results for orders placed or performed in visit on 11/23/23  POCT Glucose (CBG)  Result Value Ref Range   POC Glucose 417 (A) 70 - 99 mg/dl    Last CBC Lab Results  Component Value Date   WBC 5.8 10/18/2023   HGB 12.9 10/18/2023   HCT 40.8 10/18/2023   MCV 91 10/18/2023   MCH 28.7 10/18/2023   RDW 13.3 10/18/2023   PLT 91 (LL) 10/18/2023   Last metabolic panel Lab Results  Component Value Date   GLUCOSE 281 (H) 10/18/2023   NA 138  10/18/2023   K 4.3 10/18/2023   CL 101 10/18/2023   CO2 21 10/18/2023   BUN 18 10/18/2023   CREATININE 1.28 (H) 10/18/2023   EGFR 44 (L) 10/18/2023   CALCIUM 8.6 (L) 10/18/2023   PHOS 2.9 07/28/2022   PROT 7.1 10/18/2023   ALBUMIN 4.1 10/18/2023   LABGLOB 3.0 10/18/2023   AGRATIO 1.5 02/01/2018   BILITOT 0.7 10/18/2023   ALKPHOS 117 10/18/2023   AST 25 10/18/2023   ALT 20 10/18/2023   ANIONGAP 8 08/18/2022   Last lipids Lab Results  Component Value Date   CHOL 107 10/18/2023   HDL 50 10/18/2023   LDLCALC 34 10/18/2023   TRIG 129 10/18/2023   CHOLHDL 2.1 10/18/2023   Last hemoglobin A1c Lab Results  Component Value Date   HGBA1C 11.5 (A) 10/17/2023   Last thyroid functions Lab Results  Component Value Date   TSH 1.950 02/01/2018   Last vitamin D No results found for: 25OHVITD2, 25OHVITD3, VD25OH Last vitamin B12 and  Folate No results found for: VITAMINB12, FOLATE    The ASCVD Risk score (Arnett DK, et al., 2019) failed to calculate for the following reasons:   The valid total cholesterol range is 130 to 320 mg/dL    Assessment & Plan:  1. Type 2 diabetes mellitus with hyperglycemia, with long-term current use of insulin  (HCC) (Primary) - Her diabetes is not under control,due to non compliance. She was provided with another glucometer, advised patient and daughter to check her blood glucose bid, record and bring log to follow up appointment. She was advised that her fasting reading should be between 80-130 mg/dl. She was encouraged to increase water intake, and take medications as prescribed. - POCT Glucose (CBG) - metFORMIN  (GLUCOPHAGE -XR) 500 MG 24 hr tablet; Take 1 tablet (500 mg total) by mouth daily with breakfast for 30 doses.  Dispense: 30 tablet; Refill: 0 - Renal Function Panel; Future  2. Hypertension associated with diabetes (HCC) - Her blood pressure is improving, will continue current medication, DASH diet. - irbesartan  (AVAPRO ) 75 MG tablet; Take 1 tablet (75 mg total) by mouth daily.  Dispense: 30 tablet; Refill: 0  3. Insomnia, unspecified type - She was restarted on 25 mg Trazodone , advised to practice sleep hygiene. She was educated on medication side effects and advised to notify clinic and go to the ED. - traZODone  (DESYREL ) 50 MG tablet; Take 0.5 tablets (25 mg total) by mouth at bedtime.  Dispense: 15 tablet; Refill: 0  4. Hyperlipidemia associated with type 2 diabetes mellitus (HCC) - She will continue on current medication, low fat/cholesterol diet. - pravastatin  (PRAVACHOL ) 40 MG tablet; Take 1 tablet (40 mg total) by mouth daily.  Dispense: 30 tablet; Refill: 0  5. Urinary tract infection with hematuria, site unspecified - She was advised to increase water intake, perform proper perineal hygiene and will recheck urinalysis and culture. - UA/M w/rflx Culture, Routine;  Future    Return in about 2 weeks (around 12/07/2023), or if symptoms worsen or fail to improve, for Lab 11/29/23,  and F/U 12/07/23 in clinic.    Fredi Geiler E Melo Stauber, NP

## 2023-11-26 LAB — MICROSCOPIC EXAMINATION
Casts: NONE SEEN /[LPF]
RBC, Urine: NONE SEEN /[HPF] (ref 0–2)
WBC, UA: >30 /[HPF] — AB (ref 0–5)

## 2023-11-26 LAB — UA/M W/RFLX CULTURE, ROUTINE
Bilirubin, UA: NEGATIVE
Ketones, UA: NEGATIVE
Nitrite, UA: POSITIVE — AB
Specific Gravity, UA: 1.027 (ref 1.005–1.030)
Urobilinogen, Ur: 0.2 mg/dL (ref 0.2–1.0)
pH, UA: 5.5 (ref 5.0–7.5)

## 2023-11-26 LAB — URINE CULTURE, REFLEX

## 2023-11-26 LAB — SPECIMEN STATUS REPORT

## 2023-11-27 ENCOUNTER — Encounter: Payer: Self-pay | Admitting: Emergency Medicine

## 2023-11-27 ENCOUNTER — Other Ambulatory Visit: Payer: Self-pay

## 2023-11-27 DIAGNOSIS — Z5321 Procedure and treatment not carried out due to patient leaving prior to being seen by health care provider: Secondary | ICD-10-CM | POA: Insufficient documentation

## 2023-11-27 DIAGNOSIS — Z20822 Contact with and (suspected) exposure to covid-19: Secondary | ICD-10-CM | POA: Insufficient documentation

## 2023-11-27 DIAGNOSIS — J101 Influenza due to other identified influenza virus with other respiratory manifestations: Secondary | ICD-10-CM | POA: Insufficient documentation

## 2023-11-27 LAB — URINALYSIS, ROUTINE W REFLEX MICROSCOPIC
Bilirubin Urine: NEGATIVE
Glucose, UA: >=500 mg/dL — AB
Ketones, ur: NEGATIVE mg/dL
Leukocytes,Ua: NEGATIVE
Nitrite: POSITIVE — AB
Protein, ur: 100 mg/dL — AB
Specific Gravity, Urine: 1.022 (ref 1.005–1.030)
pH: 6 (ref 5.0–8.0)

## 2023-11-27 LAB — CBC
HCT: 35.5 % — ABNORMAL LOW (ref 36.0–46.0)
Hemoglobin: 11.9 g/dL — ABNORMAL LOW (ref 12.0–15.0)
MCH: 28.6 pg (ref 26.0–34.0)
MCHC: 33.5 g/dL (ref 30.0–36.0)
MCV: 85.3 fL (ref 80.0–100.0)
Platelets: 107 10*3/uL — ABNORMAL LOW (ref 150–400)
RBC: 4.16 MIL/uL (ref 3.87–5.11)
RDW: 11.9 % (ref 11.5–15.5)
WBC: 4.8 10*3/uL (ref 4.0–10.5)
nRBC: 0 % (ref 0.0–0.2)

## 2023-11-27 LAB — COMPREHENSIVE METABOLIC PANEL
ALT: 28 U/L (ref 0–44)
AST: 38 U/L (ref 15–41)
Albumin: 4 g/dL (ref 3.5–5.0)
Alkaline Phosphatase: 91 U/L (ref 38–126)
Anion gap: 12 (ref 5–15)
BUN: 15 mg/dL (ref 8–23)
CO2: 22 mmol/L (ref 22–32)
Calcium: 8.4 mg/dL — ABNORMAL LOW (ref 8.9–10.3)
Chloride: 93 mmol/L — ABNORMAL LOW (ref 98–111)
Creatinine, Ser: 1.25 mg/dL — ABNORMAL HIGH (ref 0.44–1.00)
GFR, Estimated: 45 mL/min — ABNORMAL LOW (ref >60)
Glucose, Bld: 521 mg/dL (ref 70–99)
Potassium: 5.1 mmol/L (ref 3.5–5.1)
Sodium: 127 mmol/L — ABNORMAL LOW (ref 135–145)
Total Bilirubin: 0.8 mg/dL (ref 0.0–1.2)
Total Protein: 7.6 g/dL (ref 6.5–8.1)

## 2023-11-27 LAB — RESP PANEL BY RT-PCR (RSV, FLU A&B, COVID)  RVPGX2
Influenza A by PCR: POSITIVE — AB
Influenza B by PCR: NEGATIVE
Resp Syncytial Virus by PCR: NEGATIVE
SARS Coronavirus 2 by RT PCR: NEGATIVE

## 2023-11-27 LAB — TROPONIN I (HIGH SENSITIVITY): Troponin I (High Sensitivity): 25 ng/L — ABNORMAL HIGH (ref <18)

## 2023-11-27 MED ORDER — ACETAMINOPHEN 500 MG PO TABS
1000.0000 mg | ORAL_TABLET | Freq: Once | ORAL | Status: AC
  Administered 2023-11-27: 1000 mg via ORAL
  Filled 2023-11-27: qty 2

## 2023-11-27 NOTE — ED Triage Notes (Signed)
 Pt in via POV w/ multiple complaints, reports fever, productive cough, diarrhea, generalized weakness and dizziness x approximately 4 days. Also reports being treated for UTI approximately 2 weeks ago.  Family member also reports problems w/ patients blood sugar; reading 527 at home; did not get her insulin  today.

## 2023-11-27 NOTE — ED Provider Triage Note (Signed)
 Emergency Medicine Provider Triage Evaluation Note  Brianna Church , a 78 y.o. female  was evaluated in triage.  Pt complains of with cough, fever, poor appetite in the last 4 days.   Review of Systems  Positive:  Negative:   Physical Exam  BP (!) 149/69 (BP Location: Left Arm)   Pulse 79   Temp 98.4 F (36.9 C) (Oral)   Resp 15   Ht 5' 3 (1.6 m)   Wt 59 kg   SpO2 98%   BMI 23.03 kg/m  Gen:   Awake, no distress   Resp:  Normal effort  MSK:   Moves extremities without difficulty  Other:  Right posterior calf presence of ulcer  Medical Decision Making  Medically screening exam initiated at 6:30 PM.  Appropriate orders placed.  Brianna Church was informed that the remainder of the evaluation will be completed by another provider, this initial triage assessment does not replace that evaluation, and the importance of remaining in the ED until their evaluation is complete.  Patient with upper respiratory symptoms, ordered respiratory panel, CBC   Brianna Kast, PA-C 11/27/23 1831

## 2023-11-28 ENCOUNTER — Emergency Department
Admission: EM | Admit: 2023-11-28 | Discharge: 2023-11-28 | Discharge status: Against Medical Advice | Payer: Self-pay | Attending: Student | Admitting: Student

## 2023-11-28 ENCOUNTER — Emergency Department: Payer: Self-pay

## 2023-11-28 ENCOUNTER — Emergency Department
Admission: EM | Admit: 2023-11-28 | Discharge: 2023-11-28 | Disposition: A | Discharge status: Home / Self Care | Payer: Self-pay | Attending: Emergency Medicine | Admitting: Emergency Medicine

## 2023-11-28 DIAGNOSIS — Z20822 Contact with and (suspected) exposure to covid-19: Secondary | ICD-10-CM | POA: Insufficient documentation

## 2023-11-28 DIAGNOSIS — J101 Influenza due to other identified influenza virus with other respiratory manifestations: Secondary | ICD-10-CM | POA: Insufficient documentation

## 2023-11-28 DIAGNOSIS — I10 Essential (primary) hypertension: Secondary | ICD-10-CM | POA: Insufficient documentation

## 2023-11-28 DIAGNOSIS — R739 Hyperglycemia, unspecified: Secondary | ICD-10-CM

## 2023-11-28 DIAGNOSIS — E1165 Type 2 diabetes mellitus with hyperglycemia: Secondary | ICD-10-CM | POA: Insufficient documentation

## 2023-11-28 LAB — BASIC METABOLIC PANEL
Anion gap: 12 (ref 5–15)
BUN: 14 mg/dL (ref 8–23)
CO2: 23 mmol/L (ref 22–32)
Calcium: 8.5 mg/dL — ABNORMAL LOW (ref 8.9–10.3)
Chloride: 94 mmol/L — ABNORMAL LOW (ref 98–111)
Creatinine, Ser: 1.35 mg/dL — ABNORMAL HIGH (ref 0.44–1.00)
GFR, Estimated: 41 mL/min — ABNORMAL LOW (ref >60)
Glucose, Bld: 540 mg/dL (ref 70–99)
Potassium: 4.6 mmol/L (ref 3.5–5.1)
Sodium: 129 mmol/L — ABNORMAL LOW (ref 135–145)

## 2023-11-28 LAB — CBC
HCT: 35.3 % — ABNORMAL LOW (ref 36.0–46.0)
Hemoglobin: 11.9 g/dL — ABNORMAL LOW (ref 12.0–15.0)
MCH: 29.2 pg (ref 26.0–34.0)
MCHC: 33.7 g/dL (ref 30.0–36.0)
MCV: 86.5 fL (ref 80.0–100.0)
Platelets: 100 10*3/uL — ABNORMAL LOW (ref 150–400)
RBC: 4.08 MIL/uL (ref 3.87–5.11)
RDW: 11.7 % (ref 11.5–15.5)
WBC: 4.6 10*3/uL (ref 4.0–10.5)
nRBC: 0 % (ref 0.0–0.2)

## 2023-11-28 LAB — URINALYSIS, ROUTINE W REFLEX MICROSCOPIC
Bacteria, UA: NONE SEEN
Bilirubin Urine: NEGATIVE
Glucose, UA: >=500 mg/dL — AB
Ketones, ur: NEGATIVE mg/dL
Leukocytes,Ua: NEGATIVE
Nitrite: NEGATIVE
Protein, ur: 100 mg/dL — AB
Specific Gravity, Urine: 1.017 (ref 1.005–1.030)
pH: 6 (ref 5.0–8.0)

## 2023-11-28 LAB — RESP PANEL BY RT-PCR (RSV, FLU A&B, COVID)  RVPGX2
Influenza A by PCR: POSITIVE — AB
Influenza B by PCR: NEGATIVE
Resp Syncytial Virus by PCR: NEGATIVE
SARS Coronavirus 2 by RT PCR: NEGATIVE

## 2023-11-28 LAB — TROPONIN I (HIGH SENSITIVITY): Troponin I (High Sensitivity): 31 ng/L — ABNORMAL HIGH (ref <18)

## 2023-11-28 LAB — CBG MONITORING, ED
Glucose-Capillary: 504 mg/dL (ref 70–99)
Glucose-Capillary: 403 mg/dL — ABNORMAL HIGH (ref 70–99)
Glucose-Capillary: 342 mg/dL — ABNORMAL HIGH (ref 70–99)
Glucose-Capillary: 168 mg/dL — ABNORMAL HIGH (ref 70–99)

## 2023-11-28 MED ORDER — INSULIN ASPART 100 UNIT/ML IJ SOLN
10.0000 [IU] | Freq: Once | INTRAMUSCULAR | Status: AC
  Administered 2023-11-28: 10 [IU] via INTRAVENOUS
  Filled 2023-11-28: qty 1

## 2023-11-28 MED ORDER — SODIUM CHLORIDE 0.9 % IV BOLUS
1000.0000 mL | Freq: Once | INTRAVENOUS | Status: AC
  Administered 2023-11-28: 1000 mL via INTRAVENOUS

## 2023-11-28 MED ORDER — OSELTAMIVIR PHOSPHATE 30 MG PO CAPS: 30.0000 mg | ORAL_CAPSULE | Freq: Two times a day (BID) | ORAL | 0 refills | Status: AC

## 2023-11-28 NOTE — ED Notes (Signed)
 Beta lab tube sent if needed.

## 2023-11-28 NOTE — ED Notes (Signed)
 No answer when called several times from lobby

## 2023-11-28 NOTE — ED Triage Notes (Signed)
 Pt here with a headache, fever, and diarrhea for a few days. Pt also c/o high blood sugars at home. Pt states she was here last night for same but left due to the wait time. Pt stable in triage.

## 2023-11-28 NOTE — Discharge Instructions (Addendum)
 Please follow-up with your regular doctor as needed.  Please watch your blood sugars over the next several days.

## 2023-11-28 NOTE — ED Provider Notes (Signed)
 [facility] Provider Note    Event Date/Time   First MD Initiated Contact with Patient 11/28/23 1504     (approximate)   History   Headache and Hyperglycemia   HPI Brianna Church is a 78 y.o. female with history of DM2, HTN, HLD presenting today for congestion.  Patient states for the past 3 days she has had cough, congestion, and bodyaches.  She has also noted her blood sugar being high at home with difficulty getting it back to a normal level.  She had a mild headache today.  Also reports 1 episode of diarrhea yesterday.  Otherwise denies chest pain, shortness of breath, abdominal pain, nausea, vomiting, dysuria.     Physical Exam   Triage Vital Signs: ED Triage Vitals [11/28/23 1049]  Encounter Vitals Group     BP 139/80     Systolic BP Percentile      Diastolic BP Percentile      Pulse Rate 77     Resp 18     Temp 98.5 F (36.9 C)     Temp Source Oral     SpO2 99 %     Weight      Height      Head Circumference      Peak Flow      Pain Score      Pain Loc      Pain Education      Exclude from Growth Chart     Most recent vital signs: Vitals:   11/28/23 1533 11/28/23 1553  BP:  122/68  Pulse:  81  Resp:  (!) 21  Temp: 99.2 F (37.3 C)   SpO2:  100%   Physical Exam: I have reviewed the vital signs and nursing notes. General: Awake, alert, no acute distress.  Nontoxic appearing. Head:  Atraumatic, normocephalic.   ENT:  EOM intact, PERRL. Oral mucosa is pink and moist with no lesions. Neck: Neck is supple with full range of motion, No meningeal signs. Cardiovascular:  RRR, No murmurs. Peripheral pulses palpable and equal bilaterally. Respiratory:  Symmetrical chest wall expansion.  No rhonchi, rales, or wheezes.  Good air movement throughout.  No use of accessory muscles.   Musculoskeletal:  No cyanosis or edema. Moving extremities with full ROM Abdomen:  Soft, nontender, nondistended. Neuro:  GCS 15, moving all four  extremities, interacting appropriately. Speech clear. Psych:  Calm, appropriate.   Skin:  Warm, dry, no rash.    ED Results / Procedures / Treatments   Labs (all labs ordered are listed, but only abnormal results are displayed) Labs Reviewed  RESP PANEL BY RT-PCR (RSV, FLU A&B, COVID)  RVPGX2 - Abnormal; Notable for the following components:      Result Value   Influenza A by PCR POSITIVE (*)    All other components within normal limits  BASIC METABOLIC PANEL - Abnormal; Notable for the following components:   Sodium 129 (*)    Chloride 94 (*)    Glucose, Bld 540 (*)    Creatinine, Ser 1.35 (*)    Calcium 8.5 (*)    GFR, Estimated 41 (*)    All other components within normal limits  CBC - Abnormal; Notable for the following components:   Hemoglobin 11.9 (*)    HCT 35.3 (*)    Platelets 100 (*)    All other components within normal limits  URINALYSIS, ROUTINE W REFLEX MICROSCOPIC - Abnormal; Notable for the following components:   Color, Urine YELLOW (*)  APPearance HAZY (*)    Glucose, UA >=500 (*)    Hgb urine dipstick SMALL (*)    Protein, ur 100 (*)    All other components within normal limits  CBG MONITORING, ED - Abnormal; Notable for the following components:   Glucose-Capillary 504 (*)    All other components within normal limits  CBG MONITORING, ED - Abnormal; Notable for the following components:   Glucose-Capillary 403 (*)    All other components within normal limits  CBG MONITORING, ED - Abnormal; Notable for the following components:   Glucose-Capillary 342 (*)    All other components within normal limits  CBG MONITORING, ED - Abnormal; Notable for the following components:   Glucose-Capillary 168 (*)    All other components within normal limits     EKG    RADIOLOGY Independently interpreted chest x-ray with no acute pathology   PROCEDURES:  Critical Care performed: No  Procedures   MEDICATIONS ORDERED IN ED: Medications  sodium chloride   0.9 % bolus 1,000 mL (0 mLs Intravenous Stopping previously hung infusion 11/28/23 1306)  insulin  aspart (novoLOG ) injection 10 Units (10 Units Intravenous Given 11/28/23 1605)     IMPRESSION / MDM / ASSESSMENT AND PLAN / ED COURSE  I reviewed the triage vital signs and the nursing notes.                              Differential diagnosis includes, but is not limited to, COVID, flu, RSV, other viral URI, pneumonia, hyperglycemia secondary to stress-induced  Patient's presentation is most consistent with acute complicated illness / injury requiring diagnostic workup.  Is a 78 year old female presenting today for cough, congestion, and hyperglycemia.  Vital signs are stable and physical exam largely unremarkable.  Laboratory workup is notable for hyperglycemia but no evidence of DKA.  UA negative.  Chest x-ray negative.  Patient's respiratory panel was positive for influenza A likely explaining all of her symptoms.  After getting 1 L of fluid and 10 units of insulin , blood glucose improved.  Vital signs otherwise stable and patient's physical exam reassuring.  Will discharge her on Tamiflu  given that she is within the first 2 to 3 days of symptoms and likely to benefit from it.  Told to follow-up with PCP and given strict return precautions.  The patient is on the cardiac monitor to evaluate for evidence of arrhythmia and/or significant heart rate changes. Clinical Course as of 11/28/23 1737  Tue Nov 28, 2023  1723 Influenza A By PCR(!): POSITIVE [DW]  1727 Glucose-Capillary(!): 168 [DW]    Clinical Course User Index [DW] Malvina Alm DASEN, MD     FINAL CLINICAL IMPRESSION(S) / ED DIAGNOSES   Final diagnoses:  Influenza A  Hyperglycemia     Rx / DC Orders   ED Discharge Orders          Ordered    oseltamivir  (TAMIFLU ) 30 MG capsule  2 times daily        11/28/23 1737             Note:  This document was prepared using Dragon voice recognition software and may include  unintentional dictation errors.   Malvina Alm DASEN, MD 11/28/23 [phone]

## 2023-11-29 ENCOUNTER — Other Ambulatory Visit: Payer: Self-pay

## 2023-11-30 ENCOUNTER — Ambulatory Visit: Payer: Self-pay | Admitting: Gerontology

## 2023-11-30 VITALS — BP 115/70 | HR 81 | Ht 61.5 in | Wt 127.6 lb

## 2023-11-30 DIAGNOSIS — L0390 Cellulitis, unspecified: Secondary | ICD-10-CM | POA: Insufficient documentation

## 2023-11-30 DIAGNOSIS — L03115 Cellulitis of right lower limb: Secondary | ICD-10-CM

## 2023-11-30 DIAGNOSIS — R051 Acute cough: Secondary | ICD-10-CM

## 2023-11-30 DIAGNOSIS — R059 Cough, unspecified: Secondary | ICD-10-CM | POA: Insufficient documentation

## 2023-11-30 LAB — URINE CULTURE: Culture: >=100000 — AB

## 2023-11-30 MED ORDER — CEPHALEXIN 500 MG PO CAPS
500.0000 mg | ORAL_CAPSULE | Freq: Two times a day (BID) | ORAL | 0 refills | Status: DC
  Filled 2023-11-30: qty 10, 5d supply, fill #0

## 2023-11-30 MED ORDER — BENZONATATE 100 MG PO CAPS
100.0000 mg | ORAL_CAPSULE | Freq: Two times a day (BID) | ORAL | 0 refills | Status: DC | PRN
  Filled 2023-11-30: qty 20, 10d supply, fill #0

## 2023-11-30 NOTE — Patient Instructions (Signed)
Cephalexin Capsules or Tablets Qu es este medicamento? La CEFALEXINA trata infecciones causadas por bacterias. Pertenece a un grupo de medicamentos llamados cefalosporinas. No se utiliza para tratar resfros, gripe o infecciones causadas por virus. Este medicamento puede ser utilizado para otros usos; si tiene alguna pregunta consulte con su proveedor de atencin mdica o con su farmacutico. MARCAS COMUNES: Biocef, Daxbia, Keflex, Keftab Qu le debo informar a mi profesional de la salud antes de tomar este medicamento? Necesitan saber si usted presenta alguno de los siguientes problemas o situaciones: Trastorno de sangrado Enfermedad renal Enfermedad heptica Convulsiones Problemas estomacales o intestinales como colitis Runner, broadcasting/film/video o inusual a la cefalexina, a otros antibiticos del grupo de las penicilinas o cefalosporinas, a otros medicamentos, alimentos, colorantes o conservantes Si est en embarazo o buscando quedar en embarazo Si est amamantando a un beb Cmo debo Visual merchandiser medicamento? Tome este medicamento por va oral. selo segn las instrucciones en la etiqueta a la misma hora todos [place]. Puede tomarlo con o sin alimentos. Si el Social worker, tmelo con alimentos. Use todo este medicamento a menos que su equipo de [employer] diga que deje de usarlo antes de terminarlo. Siga usndolo incluso si cree que est mejor. Hable con su equipo de atencin sobre el uso de este medicamento en nios. Aunque se puede recetar para ciertas afecciones, existen precauciones que deben tomarse. Sobredosis: Pngase en contacto inmediatamente con un centro toxicolgico o una sala de urgencia si usted cree que haya tomado demasiado medicamento.<br>ATENCIN: [employer] es solo para usted. No comparta este medicamento con nadie. Qu sucede si me olvido de una dosis? Si olvida una dosis, adminstrela lo antes posible. Si es casi la hora de la prxima  dosis, administre solo esa dosis. No se administre dosis adicionales o dobles. Qu puede interactuar con este medicamento? Probenecid Algunos otros antibiticos Puede ser que esta lista no menciona todas las posibles interacciones. Informe a su profesional de [employer] de [employer] productos a base de hierbas, medicamentos de [place] o suplementos nutritivos que est tomando. Si usted fuma, consume bebidas alcohlicas o si utiliza drogas ilegales, indqueselo tambin a su profesional de [employer]. Algunas sustancias pueden interactuar con su medicamento. A qu debo estar atento al usar [employer]? Si los sntomas no comienzan a mejorar o si empeoran, consulte con su equipo de atencin. No trate la diarrea con productos de [employer]. Contacte a su equipo de atencin si tiene diarrea por ms de [address], o si es grave y [place]. Este medicamento podra causar reacciones graves en la piel. Pueden presentarse semanas a meses despus de comenzar a Astronomer. Contacte a su equipo de atencin de inmediato si nota que tiene fiebre o sntomas gripales con una erupcin. La erupcin puede ser roja o Clarisa Fling, y luego puede convertirse en ampollas o descamacin de la piel. O bien, es posible que observe una erupcin roja con hinchazn en la cara, los labios o los ganglios linfticos en el cuello o debajo de los brazos. Si tiene diabetes, es posible que obtenga un resultado falso-positivo para Psychologist, counselling orina. Consulte a su equipo de atencin. Qu efectos secundarios puedo tener al [employer] este medicamento? Efectos secundarios que debe informar a su equipo de atencin tan pronto como sea posible: Reacciones alrgicas: erupcin cutnea, comezn/picazn, urticaria, hinchazn de la cara, los labios, la lengua o la garganta Enrojecimiento, formacin de Chartered loss adjuster, descamacin o distensin de la piel, incluso dentro de la boca  Diarrea grave, fiebre Flujo vaginal inusual, comezn/picazn u  olor Efectos secundarios que generalmente no requieren atencin mdica (debe informarlos a su equipo de atencin si persisten o si son molestos): Diarrea Dolor de cabeza Nuseas Puede ser que esta lista no menciona todos los posibles efectos secundarios. Comunquese a su mdico por asesoramiento mdico [employer]. Usted puede informar los efectos secundarios a la FDA por telfono al 1-800-FDA-1088. Dnde debo guardar mi medicina? Mantenga fuera del alcance de nios y Neurosurgeon. Guarde a [employer], entre 20 y 25 grados Celsius (68 y 92 grados Fahrenheit). Deseche todo el medicamento que no haya utilizado despus de la fecha de vencimiento. ATENCIN: Este folleto es un resumen. Puede ser que no cubra toda la posible informacin. Si usted tiene preguntas acerca de esta medicina, consulte con su mdico, su farmacutico o su profesional de Radiographer, therapeutic.  2024 Elsevier/Gold Standard (2023-02-09 00:00:00)

## 2023-11-30 NOTE — Progress Notes (Signed)
Established Patient Office Visit  Subjective   Patient ID: Brianna Church, female    DOB: Feb 11, 1946  Age: 78 y.o. MRN: [id number]  Chief Complaint  Patient presents with   Follow-up    Recently in ER. Pt has a rash on the right upper ankle     HPI  Brianna Church is a 78 y/o female who has history of type 2 diabetes, hyperlipidemia, hypertension,presents c/o lesion to posterior aspect of right calf area that started  2 weeks ago. She has scattered varicose vein to her legs and states that she scratched her swollen vein,and noticed the dark eschar measuring .5cm surounded by erythema, swelling, pain with walking and she limps on her right leg. She denies fever, chills, muscle nor motor weakness.   She was seen at the ED on 11/28/23 for hyperglycemia, headache and she tested positive for Influenza A  and was treated with Oseltamivir (Tamiflu)  30 mg bid.  She state that she continues to experience no productive cough that keeps her up at night. She states that she's still feeling weak due to Influenza and she offers no further complaint.    Review of Systems  Constitutional: Negative.   Respiratory: Negative.    Cardiovascular: Negative.   Skin:        Eschar to posterior aspect of right leg, surrounded by erythema, swelling and tenderness with palpation.  Neurological: Negative.   Psychiatric/Behavioral: Negative.        Objective:     BP 115/70 (BP Location: Right Arm, Patient Position: Sitting, Cuff Size: Normal)   Pulse 81   Ht 5' 1.5" (1.562 m)   Wt 127 lb 9.6 oz (57.9 kg)   SpO2 95%   BMI 23.72 kg/m  BP Readings from Last 3 Encounters:  11/30/23 115/70  11/28/23 122/68  11/27/23 (!) 130/99   Wt Readings from Last 3 Encounters:  11/30/23 127 lb 9.6 oz (57.9 kg)  11/28/23 130 lb (59 kg)  11/27/23 130 lb (59 kg)      Physical Exam HENT:     Head: Normocephalic and atraumatic.     Mouth/Throat:     Mouth: Mucous membranes are moist.  Eyes:     Pupils:  Pupils are equal, round, and reactive to light.  Cardiovascular:     Rate and Rhythm: Normal rate and regular rhythm.     Pulses: Normal pulses.     Heart sounds: Normal heart sounds.  Pulmonary:     Effort: Pulmonary effort is normal.     Breath sounds: Normal breath sounds.  Skin:    Findings: Erythema and lesion present.     Comments: Eschar to posterior aspect of right leg, surrounded by erythema, swelling and tenderness with palpation.   Neurological:     General: No focal deficit present.     Mental Status: She is alert and oriented to person, place, and time.  Psychiatric:        Mood and Affect: Mood normal.        Behavior: Behavior normal.        Thought Content: Thought content normal.        Judgment: Judgment normal.      No results found for any visits on 11/30/23.  Last CBC Lab Results  Component Value Date   WBC 4.6 11/28/2023   HGB 11.9 (L) 11/28/2023   HCT 35.3 (L) 11/28/2023   MCV 86.5 11/28/2023   MCH 29.2 11/28/2023   RDW 11.7 11/28/2023   PLT  100 (L) 11/28/2023   Last metabolic panel Lab Results  Component Value Date   GLUCOSE 540 (HH) 11/28/2023   NA 129 (L) 11/28/2023   K 4.6 11/28/2023   CL 94 (L) 11/28/2023   CO2 23 11/28/2023   BUN 14 11/28/2023   CREATININE 1.35 (H) 11/28/2023   GFRNONAA 41 (L) 11/28/2023   CALCIUM 8.5 (L) 11/28/2023   PHOS 2.9 07/28/2022   PROT 7.6 11/27/2023   ALBUMIN 4.0 11/27/2023   LABGLOB 3.0 10/18/2023   AGRATIO 1.5 02/01/2018   BILITOT 0.8 11/27/2023   ALKPHOS 91 11/27/2023   AST 38 11/27/2023   ALT 28 11/27/2023   ANIONGAP 12 11/28/2023   Last lipids Lab Results  Component Value Date   CHOL 107 10/18/2023   HDL 50 10/18/2023   LDLCALC 34 10/18/2023   TRIG 129 10/18/2023   CHOLHDL 2.1 10/18/2023   Last hemoglobin A1c Lab Results  Component Value Date   HGBA1C 11.5 (A) 10/17/2023   Last thyroid functions Lab Results  Component Value Date   TSH 1.950 02/01/2018   Last vitamin D No  results found for: "25OHVITD2", "25OHVITD3", "VD25OH" Last vitamin B12 and Folate No results found for: "VITAMINB12", "FOLATE"    The ASCVD Risk score (Arnett DK, et al., 2019) failed to calculate for the following reasons:   The valid total cholesterol range is 130 to 320 mg/dL    Assessment & Plan:   1. Cellulitis of right lower extremity (Primary) - Possible cellulitis,and pain,  was started on Keflex for cellulitis, educated on medication side effects and advised to notify clinic. - cephALEXin (KEFLEX) 500 MG capsule; Take 1 capsule (500 mg total) by mouth 2 (two) times daily.  Dispense: 10 capsule; Refill: 0  2. Acute cough - She was started on Tessalon for cough. Educated on medication side effects and advised to notify clinic. - benzonatate (TESSALON PERLES) 100 MG capsule; Take 1 capsule (100 mg total) by mouth 2 (two) times daily as needed.  Dispense: 20 capsule; Refill: 0   Return in about 1 week (around 12/07/2023), or if symptoms worsen or fail to improve.    Trequan Marsolek Trellis Paganini, NP

## 2023-12-01 ENCOUNTER — Other Ambulatory Visit: Payer: Self-pay

## 2023-12-07 ENCOUNTER — Ambulatory Visit: Payer: Self-pay | Admitting: Gerontology

## 2023-12-07 VITALS — BP 154/79 | HR 83 | Temp 98.3°F | Ht 61.0 in | Wt 124.4 lb

## 2023-12-07 DIAGNOSIS — L03115 Cellulitis of right lower limb: Secondary | ICD-10-CM

## 2023-12-07 LAB — GLUCOSE, POCT (MANUAL RESULT ENTRY): POC Glucose: 428 mg/dL — AB (ref 70–99)

## 2023-12-07 NOTE — Progress Notes (Signed)
Established Patient Office Visit  Subjective   Patient ID: Brianna Church, female    DOB: 1946/09/10  Age: 78 y.o. MRN: [id number]  Chief Complaint  Patient presents with   Follow-up    Pt is still having pain in lower extremity where cellulitis. No fever but lingering cough    HPI  Brianna Church is a 78 y/o female who has history of type 2 diabetes, hyperlipidemia, hypertension,presents for follow up of lesion to posterior aspect of right calf area that started  2 weeks ago. She finsihed her last dose of Keflex today, but eschar measures 0.6 x 0.5 cm and erythema surrounding the eschar measures 2 x 2 cm more than last week. There is swelling and tenderness to site when touched.  She denies fever  and chills, but continues to limp on her right leg due to pain. She states that pain is 9/10 and walking aggravates symptoms. Her HgbA1c was 11.5%,  blood glucose was 428 mg/dl during visit and daughter stated that her fasting reading was 300 mg/dl.Overall, she states that she's concerned about the wound to her leg.     Patient Active Problem List   Diagnosis Date Noted   Cellulitis 11/30/2023   Cough 11/30/2023   Abnormal renal function 10/26/2023   Health care maintenance 10/17/2023   Joint pain 10/17/2023   Blurry vision, bilateral 10/17/2023   Encounter to establish care 09/20/2022   Hyperglycemia 08/17/2022   Hyperglycemia due to type 2 diabetes mellitus (HCC) 07/27/2022   AKI (acute kidney injury) (HCC) 07/27/2022   UTI (urinary tract infection) 07/27/2022   Thrombocytopenia (HCC) 07/27/2022   Hyperlipidemia associated with type 2 diabetes mellitus (HCC) 07/27/2022   URTI (acute upper respiratory infection) 07/27/2022   Headache 07/27/2022   Uncontrolled diabetes mellitus with hyperglycemia (HCC) 07/27/2022   Hyponatremia    Diabetes (HCC) 10/27/2015   Hypertension associated with diabetes (HCC) 10/27/2015   GERD (gastroesophageal reflux disease) 10/27/2015    Shoulder pain 07/28/2015   Past Medical History:  Diagnosis Date   Diabetes mellitus without complication (HCC)    GERD (gastroesophageal reflux disease)    Hypertension    Past Surgical History:  Procedure Laterality Date   ABDOMINAL HYSTERECTOMY     CHOLECYSTECTOMY     Social History   Tobacco Use   Smoking status: Never   Smokeless tobacco: Never  Vaping Use   Vaping status: Never Used  Substance Use Topics   Alcohol use: No   Drug use: No   Family History  Problem Relation Age of Onset   Alzheimer's disease Mother    Prostate cancer Father    Cancer Sister        "gallbladder"   Stomach cancer Brother    Other Maternal Grandmother        unknown medical history   Other Maternal Grandfather        unknown medical history   Other Paternal Grandmother        unknown medical history   Other Paternal Grandfather        unknown medical history   Diabetes Half-Brother    No Known Allergies    Review of Systems  Constitutional: Negative.   Respiratory: Negative.    Cardiovascular: Negative.   Skin:        Eschar covered lesion to posterior aspect of right leg      Objective:     BP (!) 154/79 (BP Location: Right Arm, Patient Position: Sitting, Cuff Size: Normal)   Pulse  83   Temp 98.3 F (36.8 C) (Oral)   Ht 5\' 1"  (1.549 m)   Wt 124 lb 6.4 oz (56.4 kg)   SpO2 97%   BMI 23.51 kg/m  BP Readings from Last 3 Encounters:  12/07/23 (!) 154/79  11/30/23 115/70  11/28/23 122/68   Wt Readings from Last 3 Encounters:  12/07/23 124 lb 6.4 oz (56.4 kg)  11/30/23 127 lb 9.6 oz (57.9 kg)  11/28/23 130 lb (59 kg)      Physical Exam HENT:     Head: Normocephalic and atraumatic.  Cardiovascular:     Rate and Rhythm: Normal rate and regular rhythm.     Pulses: Normal pulses.     Heart sounds: Normal heart sounds.  Pulmonary:     Effort: Pulmonary effort is normal.     Breath sounds: Normal breath sounds.  Musculoskeletal:        General: Tenderness  (to lesion on  posterior aspect of right lower leg.) present.  Skin:    Findings: Erythema (surrounding eschar measuring 2x2 cm) and lesion (eschar to posterior aspect of right lower leg.) present.  Neurological:     General: No focal deficit present.     Mental Status: She is alert and oriented to person, place, and time.  Psychiatric:        Mood and Affect: Mood normal.        Behavior: Behavior normal.      Results for orders placed or performed in visit on 12/07/23  POCT Glucose (CBG)  Result Value Ref Range   POC Glucose 428 (A) 70 - 99 mg/dl    Last CBC Lab Results  Component Value Date   WBC 4.6 11/28/2023   HGB 11.9 (L) 11/28/2023   HCT 35.3 (L) 11/28/2023   MCV 86.5 11/28/2023   MCH 29.2 11/28/2023   RDW 11.7 11/28/2023   PLT 100 (L) 11/28/2023   Last metabolic panel Lab Results  Component Value Date   GLUCOSE 540 (HH) 11/28/2023   NA 129 (L) 11/28/2023   K 4.6 11/28/2023   CL 94 (L) 11/28/2023   CO2 23 11/28/2023   BUN 14 11/28/2023   CREATININE 1.35 (H) 11/28/2023   GFRNONAA 41 (L) 11/28/2023   CALCIUM 8.5 (L) 11/28/2023   PHOS 2.9 07/28/2022   PROT 7.6 11/27/2023   ALBUMIN 4.0 11/27/2023   LABGLOB 3.0 10/18/2023   AGRATIO 1.5 02/01/2018   BILITOT 0.8 11/27/2023   ALKPHOS 91 11/27/2023   AST 38 11/27/2023   ALT 28 11/27/2023   ANIONGAP 12 11/28/2023   Last lipids Lab Results  Component Value Date   CHOL 107 10/18/2023   HDL 50 10/18/2023   LDLCALC 34 10/18/2023   TRIG 129 10/18/2023   CHOLHDL 2.1 10/18/2023   Last hemoglobin A1c Lab Results  Component Value Date   HGBA1C 11.5 (A) 10/17/2023   Last thyroid functions Lab Results  Component Value Date   TSH 1.950 02/01/2018   Last vitamin D No results found for: "25OHVITD2", "25OHVITD3", "VD25OH" Last vitamin B12 and Folate No results found for: "VITAMINB12", "FOLATE"    The ASCVD Risk score (Arnett DK, et al., 2019) failed to calculate for the following reasons:   The valid  total cholesterol range is 130 to 320 mg/dL    Assessment & Plan:   1. Cellulitis of right lower extremity (Primary) - Patient lesion to posterior aspect of her right leg is not improving, erythematous area has increased to 2 x 2 cm, and  tenderness to touch. Unable to give antibiotics for Gram + and Gram - ve coverage due to renal function and unable to obtain lab. Site needs debriding and possible imaging to rule out Osteomyelitis. Due to her uncontrolled Diabetes with hyperglycemia, she was advised to go to the ED. Her daughter verbalized understanding, stating that she will take her in the morning.She was advised to check blood glucose tid, record and bring log to appointment, low carb/non concentrated sweet diet. - POCT Glucose (CBG); Future - POCT Glucose (CBG)   Return in about 5 days (around 12/12/2023), or if symptoms worsen or fail to improve.    Gracey Tolle Trellis Paganini, NP

## 2023-12-07 NOTE — Patient Instructions (Signed)
 Recuento de carbohidratos para la diabetes mellitus en los adultos Carbohydrate Counting for Diabetes Mellitus, Adult El recuento de carbohidratos es un mtodo para llevar un registro de la cantidad de carbohidratos que se ingieren. La ingesta de carbohidratos aumenta la cantidad de azcar (glucosa) en la sangre. El recuento de la cantidad de carbohidratos que ingiere mejora el control de su glucemia. Esto, a su vez, le ayuda a controlar su diabetes. Los carbohidratos se miden en gramos (g) por porcin. Es importante saber la cantidad de carbohidratos (en gramos o por tamao de porcin) que se puede ingerir en cada comida. Esto es Government social research officer. Un nutricionista puede ayudarlo a crear un plan de alimentacin y a calcular la cantidad de carbohidratos que debe ingerir en cada comida y colacin. Qu alimentos contienen carbohidratos? Los siguientes alimentos incluyen carbohidratos: Granos, como panes y cereales. Frijoles secos y productos con soja. Verduras con almidn, como papas, guisantes y maz. Nils Pyle y jugos de frutas. Leche y Dentist. Dulces y colaciones, como pasteles, galletas, caramelos, papas fritas de bolsa y refrescos. Cmo se calculan los carbohidratos de los alimentos? Hay dos maneras de calcular los carbohidratos de los alimentos. Puede leer las etiquetas de los alimentos o aprender cules son los tamaos de las porciones estndar de los alimentos. Puede usar cualquiera de estos mtodos o una combinacin de [place]. Usar la Air cabin crew de informacin nutricional La lista de Informacin nutricional est incluida en las etiquetas de casi todas las bebidas y los alimentos envasados de los [place]. Esto incluye lo siguiente: El tamao de la porcin. Informacin sobre los nutrientes de cada porcin, incluidos los gramos de carbohidratos por porcin. Para usar la Informacin nutricional, decida cuntas porciones tomar. Luego, multiplique el nmero de porciones por la cantidad  de carbohidratos por porcin. El nmero resultante es la cantidad total de carbohidratos que comer. Conocer los tamaos de las porciones estndar de los alimentos Cuando coma alimentos que contengan carbohidratos y que no estn envasados o no incluyan la informacin nutricional en la etiqueta, debe medir las porciones para poder calcular los gramos de carbohidratos. Mida los alimentos que comer con una balanza de alimentos o una taza medidora, si es necesario. Decida cuntas porciones de Programmer, systems. Multiplique el nmero de porciones por 15. En los alimentos que contienen carbohidratos, una porcin Bow a 15 g de carbohidratos. Por ejemplo, si come 2 tazas o 10 onzas (300 g) de fresas, habr comido 2 porciones y 30 g de carbohidratos (2 porciones x 15 g = 30 g). En el caso de las comidas que contienen mezclas de ms de un alimento, como las sopas y los guisos, debe calcular los carbohidratos de cada alimento que se incluye. La siguiente lista contiene los tamaos de porciones estndar de los alimentos ricos en carbohidratos ms comunes. Cada una de estas porciones tiene aproximadamente 15 g de carbohidratos: 1 rebanada de pan. 1 tortilla de seis pulgadas (15 cm). ? de taza o 2 onzas (53 g) de arroz o pasta cocidos.  taza o 3 onzas (85 g) de lentejas o frijoles cocidos o enlatados, escurridos y enjuagados.  taza o 3 onzas (85 g) de verduras con almidn, como guisantes, maz o zapallo.  taza o 4 onzas (120 g) de cereal caliente.  taza o 3 onzas (85 g) de papas hervidas o en pur, o  o 3 onzas (85 g) de una papa grande al horno.  taza o 4 onzas fluidas (118 ml) de jugo de frutas. 1 taza u  8 onzas fluidas (237 ml) de leche. 1 unidad pequea o 4 onzas (106 g) de manzana.  unidad o 2 onzas (63 g) de una banana mediana. 1 taza o 5 oz (150 g) de fresas. 3 tazas o 1 oz (28.3 g) de palomitas de maz. Cul sera un ejemplo de recuento de carbohidratos? Para calcular los gramos  de carbohidratos de este ejemplo de comida, siga los pasos que se describen a continuacin. Ejemplo de comida 3 onzas (85 g) de pechugas de pollo. ? de taza o 4 onzas (106 g) de arroz integral.  taza o 3 onzas (85 g) de maz. 1 taza u 8 onzas fluidas (237 ml) de leche. 1 taza o 5 onzas (150 g) de fresas con crema batida sin azcar. Clculo de carbohidratos Identifique los alimentos que contienen carbohidratos: Arroz. Maz. Leche. Brianna Church. Calcule cuntas porciones come de cada alimento: 2 porciones de arroz. 1 porcin de maz. 1 porcin de leche. 1 porcin de fresas. Multiplique cada nmero de porciones por 15 g: 2 porciones de arroz x 15 g = 30 g. 1 porcin de maz x 15 g = 15 g. 1 porcin de leche x 15 g = 15 g. 1 porcin de fresas x 15 g = 15 g. Sume todas las cantidades para conocer el total de gramos de carbohidratos consumidos: 30 g + 15 g + 15 g + 15 g = 75 g de carbohidratos en total. Consejos para seguir este plan Al ir de compras [place] un plan de comidas y luego haga una lista de compras. Compre verduras frescas y congeladas, frutas frescas y congeladas, productos lcteos, huevos, frijoles, lentejas y cereales integrales. Fjese en las etiquetas de los alimentos. Elija los alimentos que tengan ms fibra y Neurosurgeon. Evite los alimentos procesados y los alimentos con Biochemist, clinical. Planificacin de las comidas Trate de consumir la misma cantidad de gramos de carbohidratos en cada comida y en cada colacin. Planifique tomar comidas y colaciones regulares y equilibradas. Dnde buscar ms informacin American Diabetes Association (Asociacin Estadounidense de la Diabetes): diabetes.org Centers for Disease Control and Prevention (Centros para el Control y la Prevencin de [place]): TonerPromos.no Academy of Nutrition and Dietetics (Academia de Nutricin y Pension scheme manager): Theme [email or web address].org Association of Diabetes Care & Education Specialists (Asociacin de Especialistas en  Atencin y Educacin sobre la Diabetes): diabeteseducator.org Resumen El recuento de carbohidratos es un mtodo para llevar un registro de la cantidad de carbohidratos que se ingieren. La ingesta de carbohidratos aumenta la cantidad de azcar (glucosa) en la sangre. El recuento de la cantidad de carbohidratos que ingiere mejora el control de su glucemia. Esto le ayuda a Chief Operating Officer su diabetes. Un nutricionista puede ayudarlo a crear un plan de alimentacin y a calcular la cantidad de carbohidratos que debe ingerir en cada comida y colacin. Esta informacin no tiene Theme park manager el consejo del mdico. Asegrese de hacerle al mdico cualquier pregunta que tenga. Document Revised: 07/21/2020 Document Reviewed: 07/21/2020 Elsevier Patient Education  2024 [employer].

## 2023-12-12 ENCOUNTER — Ambulatory Visit: Payer: Self-pay | Admitting: Gerontology

## 2023-12-12 ENCOUNTER — Other Ambulatory Visit: Payer: Self-pay

## 2023-12-12 ENCOUNTER — Encounter: Payer: Self-pay | Admitting: Gerontology

## 2023-12-12 VITALS — BP 151/77 | HR 82 | Temp 97.9°F | Resp 16 | Ht 62.0 in | Wt 122.1 lb

## 2023-12-12 DIAGNOSIS — L03115 Cellulitis of right lower limb: Secondary | ICD-10-CM

## 2023-12-12 LAB — GLUCOSE, POCT (MANUAL RESULT ENTRY): POC Glucose: 330 mg/dL — AB (ref 70–99)

## 2023-12-12 MED ORDER — DOXYCYCLINE HYCLATE 100 MG PO CAPS
100.0000 mg | ORAL_CAPSULE | Freq: Two times a day (BID) | ORAL | 0 refills | Status: DC
  Filled 2023-12-12: qty 14, 7d supply, fill #0

## 2023-12-12 NOTE — Progress Notes (Signed)
Established Patient Office Visit  Subjective   Patient ID: Brianna Church, female    DOB: 07-May-1946  Age: 78 y.o. MRN: [id number]  No chief complaint on file. [employer] Spanish Interpreter was used during visit.  HPI  Brianna Church is a 78 y/o female who has history of type 2 diabetes, hyperlipidemia, hypertension,presents for follow up of lesion to posterior aspect of right calf area . She was seen at [facility] ED on 12/08/23 for cellulitis, she was treated with Bacitracin ointment and referred to the wound clinic. Currently, she states that the wound is worsening, endorses pain when walking, tenderness when erythematous area is touched, increase erythema area surrounding wound, but denies fever, chills and discharge. The eschar covering the wound measures 0.8 cm x 0.4 cm and the erythematous area surrounding the wound measures  3 cm x 2.5 cm. Her blood glucose was checked during visit and it was 330 mg/dl, states that she's compliant with her medications. Overall, she states that she's worried about her wound and offers no further complaint.    Patient Active Problem List   Diagnosis Date Noted   Cellulitis 11/30/2023   Cough 11/30/2023   Abnormal renal function 10/26/2023   Health care maintenance 10/17/2023   Joint pain 10/17/2023   Blurry vision, bilateral 10/17/2023   Encounter to establish care 09/20/2022   Hyperglycemia 08/17/2022   Hyperglycemia due to type 2 diabetes mellitus (HCC) 07/27/2022   AKI (acute kidney injury) (HCC) 07/27/2022   UTI (urinary tract infection) 07/27/2022   Thrombocytopenia (HCC) 07/27/2022   Hyperlipidemia associated with type 2 diabetes mellitus (HCC) 07/27/2022   URTI (acute upper respiratory infection) 07/27/2022   Headache 07/27/2022   Uncontrolled diabetes mellitus with hyperglycemia (HCC) 07/27/2022   Hyponatremia    Diabetes (HCC) 10/27/2015   Hypertension associated with diabetes (HCC) 10/27/2015   GERD (gastroesophageal reflux disease)  10/27/2015   Shoulder pain 07/28/2015   Past Medical History:  Diagnosis Date   Diabetes mellitus without complication (HCC)    GERD (gastroesophageal reflux disease)    Hypertension    Past Surgical History:  Procedure Laterality Date   ABDOMINAL HYSTERECTOMY     CHOLECYSTECTOMY     Social History   Tobacco Use   Smoking status: Never   Smokeless tobacco: Never  Vaping Use   Vaping status: Never Used  Substance Use Topics   Alcohol use: No   Drug use: No   Family History  Problem Relation Age of Onset   Alzheimer's disease Mother    Prostate cancer Father    Cancer Sister        "gallbladder"   Stomach cancer Brother    Other Maternal Grandmother        unknown medical history   Other Maternal Grandfather        unknown medical history   Other Paternal Grandmother        unknown medical history   Other Paternal Grandfather        unknown medical history   Diabetes Half-Brother    No Known Allergies    Review of Systems  Constitutional: Negative.   Respiratory: Negative.    Cardiovascular: Negative.   Skin:        Non healing ulcer to posterior aspect of right calf.  Neurological: Negative.   Psychiatric/Behavioral: Negative.        Objective:     Temp 97.9 F (36.6 C) (Oral)  BP Readings from Last 3 Encounters:  12/07/23 (!) 154/79  11/30/23 115/70  11/28/23 122/68   Wt Readings from Last 3 Encounters:  12/07/23 124 lb 6.4 oz (56.4 kg)  11/30/23 127 lb 9.6 oz (57.9 kg)  11/28/23 130 lb (59 kg)      Physical Exam Constitutional:      Appearance: Normal appearance.  HENT:     Head: Normocephalic and atraumatic.     Mouth/Throat:     Mouth: Mucous membranes are moist.  Eyes:     Pupils: Pupils are equal, round, and reactive to light.  Cardiovascular:     Rate and Rhythm: Normal rate and regular rhythm.     Pulses: Normal pulses.     Heart sounds: Normal heart sounds.  Pulmonary:     Effort: Pulmonary effort is normal.     Breath  sounds: Normal breath sounds.  Musculoskeletal:        General: Tenderness (Tenderness with touching erythematous area to posterior aspect of right calf) present.  Skin:    General: Skin is warm.     Findings: Erythema (erythematous area surrounding eschar covered wound to posterior aspect of right calf) and lesion (eschar covered wound to posterio aspect of right calf) present.  Neurological:     General: No focal deficit present.     Mental Status: She is alert and oriented to person, place, and time.     Gait: Gait abnormal (limping to right leg).  Psychiatric:        Mood and Affect: Mood normal.        Behavior: Behavior normal.      Results for orders placed or performed in visit on 12/12/23  POCT Glucose (CBG)  Result Value Ref Range   POC Glucose 330 (A) 70 - 99 mg/dl    Last CBC Lab Results  Component Value Date   WBC 4.6 11/28/2023   HGB 11.9 (L) 11/28/2023   HCT 35.3 (L) 11/28/2023   MCV 86.5 11/28/2023   MCH 29.2 11/28/2023   RDW 11.7 11/28/2023   PLT 100 (L) 11/28/2023   Last metabolic panel Lab Results  Component Value Date   GLUCOSE 540 (HH) 11/28/2023   NA 129 (L) 11/28/2023   K 4.6 11/28/2023   CL 94 (L) 11/28/2023   CO2 23 11/28/2023   BUN 14 11/28/2023   CREATININE 1.35 (H) 11/28/2023   GFRNONAA 41 (L) 11/28/2023   CALCIUM 8.5 (L) 11/28/2023   PHOS 2.9 07/28/2022   PROT 7.6 11/27/2023   ALBUMIN 4.0 11/27/2023   LABGLOB 3.0 10/18/2023   AGRATIO 1.5 02/01/2018   BILITOT 0.8 11/27/2023   ALKPHOS 91 11/27/2023   AST 38 11/27/2023   ALT 28 11/27/2023   ANIONGAP 12 11/28/2023   Last lipids Lab Results  Component Value Date   CHOL 107 10/18/2023   HDL 50 10/18/2023   LDLCALC 34 10/18/2023   TRIG 129 10/18/2023   CHOLHDL 2.1 10/18/2023   Last hemoglobin A1c Lab Results  Component Value Date   HGBA1C 11.5 (A) 10/17/2023   Last thyroid functions Lab Results  Component Value Date   TSH 1.950 02/01/2018   Last vitamin D No results  found for: "25OHVITD2", "25OHVITD3", "VD25OH" Last vitamin B12 and Folate No results found for: "VITAMINB12", "FOLATE"    The ASCVD Risk score (Arnett DK, et al., 2019) failed to calculate for the following reasons:   The valid total cholesterol range is 130 to 320 mg/dL    Assessment & Plan:  1. Cellulitis of right lower extremity (Primary) - She was started on Doxycycline  100 mg bid , educated on medication side effects and advised to notify clinic. She was referred to wound clinic, and to go to the ED for worsening symptoms. She was encouraged to check her blood glucose tid, record and bring log to follow up appointment and increase water intake. - POCT Glucose (CBG); Future - POCT Glucose (CBG) - Ambulatory referral to Wound Clinic - doxycycline (VIBRAMYCIN) 100 MG capsule; Take 1 capsule (100 mg total) by mouth 2 (two) times daily for 7 days.  Dispense: 14 capsule; Refill: 0   Return in about 16 days (around 12/28/2023), or if symptoms worsen or fail to improve.    Myiesha Edgar Trellis Paganini, NP

## 2023-12-22 ENCOUNTER — Other Ambulatory Visit: Payer: Self-pay

## 2023-12-22 MED ORDER — DOXYCYCLINE HYCLATE 100 MG PO CAPS
100.0000 mg | ORAL_CAPSULE | Freq: Two times a day (BID) | ORAL | 0 refills | Status: DC
  Filled 2023-12-22: qty 20, 10d supply, fill #0

## 2023-12-26 ENCOUNTER — Encounter: Payer: Self-pay | Attending: Physician Assistant | Admitting: Physician Assistant

## 2023-12-26 DIAGNOSIS — E1151 Type 2 diabetes mellitus with diabetic peripheral angiopathy without gangrene: Secondary | ICD-10-CM | POA: Insufficient documentation

## 2023-12-26 DIAGNOSIS — L03115 Cellulitis of right lower limb: Secondary | ICD-10-CM | POA: Insufficient documentation

## 2023-12-26 DIAGNOSIS — E11622 Type 2 diabetes mellitus with other skin ulcer: Secondary | ICD-10-CM | POA: Insufficient documentation

## 2023-12-26 DIAGNOSIS — L97218 Non-pressure chronic ulcer of right calf with other specified severity: Secondary | ICD-10-CM | POA: Insufficient documentation

## 2023-12-26 DIAGNOSIS — I10 Essential (primary) hypertension: Secondary | ICD-10-CM | POA: Insufficient documentation

## 2023-12-28 ENCOUNTER — Ambulatory Visit: Payer: Self-pay | Admitting: Gerontology

## 2023-12-28 ENCOUNTER — Other Ambulatory Visit: Payer: Self-pay

## 2023-12-28 ENCOUNTER — Ambulatory Visit: Payer: Self-pay

## 2023-12-28 VITALS — BP 175/82 | HR 74 | Ht 61.0 in | Wt 123.9 lb

## 2023-12-28 DIAGNOSIS — E1169 Type 2 diabetes mellitus with other specified complication: Secondary | ICD-10-CM

## 2023-12-28 DIAGNOSIS — I152 Hypertension secondary to endocrine disorders: Secondary | ICD-10-CM

## 2023-12-28 DIAGNOSIS — T148XXA Other injury of unspecified body region, initial encounter: Secondary | ICD-10-CM | POA: Insufficient documentation

## 2023-12-28 DIAGNOSIS — E1165 Type 2 diabetes mellitus with hyperglycemia: Secondary | ICD-10-CM

## 2023-12-28 DIAGNOSIS — G4700 Insomnia, unspecified: Secondary | ICD-10-CM

## 2023-12-28 MED ORDER — TRAZODONE HCL 50 MG PO TABS
25.0000 mg | ORAL_TABLET | Freq: Every day | ORAL | 0 refills | Status: DC
  Filled 2023-12-28: qty 15, 30d supply, fill #0

## 2023-12-28 MED ORDER — PRAVASTATIN SODIUM 40 MG PO TABS
40.0000 mg | ORAL_TABLET | Freq: Every day | ORAL | 0 refills | Status: DC
  Filled 2023-12-28: qty 30, 30d supply, fill #0

## 2023-12-28 MED ORDER — IRBESARTAN 75 MG PO TABS
75.0000 mg | ORAL_TABLET | Freq: Every day | ORAL | 0 refills | Status: DC
  Filled 2023-12-28: qty 30, 30d supply, fill #0

## 2023-12-28 MED ORDER — BASAGLAR KWIKPEN 100 UNIT/ML ~~LOC~~ SOPN
13.0000 [IU] | PEN_INJECTOR | Freq: Every day | SUBCUTANEOUS | 2 refills | Status: DC
Start: 2023-12-28 — End: 2024-02-06
  Filled 2023-12-28 – 2024-01-04 (×2): qty 15, 115d supply, fill #0

## 2023-12-28 NOTE — Progress Notes (Signed)
 Established Patient Office Visit  Subjective   Patient ID: Brianna Church, female    DOB: [birth year]  Age: 78 y.o. MRN: [id number]  Chief Complaint  Patient presents with   Follow-up    Pt following up on recent ED visit for cellulitis of right lower extremity     [employer]preter was used during visit. HPI  Brianna Church is a 78 y/o female who has history of type 2 diabetes, hyperlipidemia, hypertension,presents for follow up of lesion to posterior aspect of right calf area  and medication refill. She was seen at the wound clinic  on 12/26/23 by Larina Bras H E PA-C and wound was debrided.  During visit, open wound measures 1.6 mm x 1. 2 mm, depth 0.4 mm, ulcer at 6'oclock 0.4 mm, slough area, at 12 o'clock was 0.6 mm and 1.3 mm black tissue area. She changes dressing daily. Wound has minimal serosanguinous drainage, no erythema to surrounding skin and tenderness with touch. Overall, wound is improving and she will continue to follow up at the Wound clinic.  Review of Systems  Constitutional: Negative.   Cardiovascular: Negative.   Skin:        Open wound to right calf  Neurological: Negative.       Objective:     BP (!) 175/82 (BP Location: Left Arm, Patient Position: Sitting, Cuff Size: Normal)   Pulse 74   Ht 5\' 1"  (1.549 m)   Wt 123 lb 14.4 oz (56.2 kg)   SpO2 96%   BMI 23.41 kg/m  BP Readings from Last 3 Encounters:  12/28/23 (!) 175/82  12/12/23 (!) 151/77  12/07/23 (!) 154/79   Wt Readings from Last 3 Encounters:  12/28/23 123 lb 14.4 oz (56.2 kg)  12/12/23 122 lb 1.6 oz (55.4 kg)  12/07/23 124 lb 6.4 oz (56.4 kg)      Physical Exam HENT:     Head: Normocephalic and atraumatic.     Mouth/Throat:     Mouth: Mucous membranes are moist.  Eyes:     Extraocular Movements: Extraocular movements intact.     Pupils: Pupils are equal, round, and reactive to light.  Cardiovascular:     Rate and Rhythm: Normal rate and regular rhythm.     Pulses:  Normal pulses.     Heart sounds: Normal heart sounds.  Pulmonary:     Effort: Pulmonary effort is normal.     Breath sounds: Normal breath sounds.  Skin:    Findings: Lesion (open wound to posterior aspect of right calf, measurement in the HPI section,) present.  Neurological:     General: No focal deficit present.     Mental Status: She is alert and oriented to person, place, and time.  Psychiatric:        Mood and Affect: Mood normal.        Behavior: Behavior normal.      No results found for any visits on 12/28/23.  Last CBC Lab Results  Component Value Date   WBC 4.6 11/28/2023   HGB 11.9 (L) 11/28/2023   HCT 35.3 (L) 11/28/2023   MCV 86.5 11/28/2023   MCH 29.2 11/28/2023   RDW 11.7 11/28/2023   PLT 100 (L) 11/28/2023   Last metabolic panel Lab Results  Component Value Date   GLUCOSE 540 (HH) 11/28/2023   NA 129 (L) 11/28/2023   K 4.6 11/28/2023   CL 94 (L) 11/28/2023   CO2 23 11/28/2023   BUN 14 11/28/2023   CREATININE 1.35 (  H) 11/28/2023   GFRNONAA 41 (L) 11/28/2023   CALCIUM 8.5 (L) 11/28/2023   PHOS 2.9 07/28/2022   PROT 7.6 11/27/2023   ALBUMIN 4.0 11/27/2023   LABGLOB 3.0 10/18/2023   AGRATIO 1.5 02/01/2018   BILITOT 0.8 11/27/2023   ALKPHOS 91 11/27/2023   AST 38 11/27/2023   ALT 28 11/27/2023   ANIONGAP 12 11/28/2023   Last lipids Lab Results  Component Value Date   CHOL 107 10/18/2023   HDL 50 10/18/2023   LDLCALC 34 10/18/2023   TRIG 129 10/18/2023   CHOLHDL 2.1 10/18/2023   Last hemoglobin A1c Lab Results  Component Value Date   HGBA1C 11.5 (A) 10/17/2023   Last thyroid functions Lab Results  Component Value Date   TSH 1.950 02/01/2018   Last vitamin D No results found for: "25OHVITD2", "25OHVITD3", "VD25OH" Last vitamin B12 and Folate No results found for: "VITAMINB12", "FOLATE"    The ASCVD Risk score (Arnett DK, et al., 2019) failed to calculate for the following reasons:   The valid total cholesterol range is 130 to  320 mg/dL    Assessment & Plan:   1. Type 2 diabetes mellitus with hyperglycemia, with long-term current use of insulin (HCC) - She will continue current medication, low carb/non concentrated sweet diet and exercise as tolerated. - Insulin Glargine (BASAGLAR KWIKPEN) 100 UNIT/ML; Inject 13 Units into the skin at bedtime.  Dispense: 15 mL; Refill: 2 - HgB A1c; Future - Comp Met (CMET); Future  2. Hypertension associated with diabetes (HCC) - She will continue current medication and DASH diet. - irbesartan (AVAPRO) 75 MG tablet; Take 1 tablet (75 mg total) by mouth daily.  Dispense: 30 tablet; Refill: 0  3. Hyperlipidemia associated with type 2 diabetes mellitus (HCC) - She will continue current medication, low fat/cholesterol diet. - pravastatin (PRAVACHOL) 40 MG tablet; Take 1 tablet (40 mg total) by mouth daily.  Dispense: 30 tablet; Refill: 0  4. Insomnia, unspecified type - Sleep is improved and will continue current medication. - traZODone (DESYREL) 50 MG tablet; Take 0.5 tablets (25 mg total) by mouth at bedtime.  Dispense: 15 tablet; Refill: 0  5. Open wound (Primary) - She will continue daily dressing change and follow up at the Wound clinic. She was advised to notify clinic for worsening symptoms and go to the ED.   Return in about 3 weeks (around 01/18/2024), or if symptoms worsen or fail to improve.    Abu Heavin Trellis Paganini, NP

## 2023-12-28 NOTE — Patient Instructions (Signed)
Recuento de carbohidratos para la diabetes mellitus en los adultos Carbohydrate Counting for Diabetes Mellitus, Adult El recuento de carbohidratos es un mtodo para llevar un registro de la cantidad de carbohidratos que se ingieren. La ingesta de carbohidratos aumenta la cantidad de azcar (glucosa) en la sangre. El recuento de la cantidad de carbohidratos que ingiere mejora el control de su glucemia. Esto, a su vez, le ayuda a controlar su diabetes. Los carbohidratos se miden en gramos (g) por porcin. Es importante saber la cantidad de carbohidratos (en gramos o por tamao de porcin) que se puede ingerir en cada comida. Esto es Government social research officer. Un nutricionista puede ayudarlo a crear un plan de alimentacin y a calcular la cantidad de carbohidratos que debe ingerir en cada comida y colacin. Qu alimentos contienen carbohidratos? Los siguientes alimentos incluyen carbohidratos: Granos, como panes y cereales. Frijoles secos y productos con soja. Verduras con almidn, como papas, guisantes y maz. Nils Pyle y jugos de frutas. Leche y Dentist. Dulces y colaciones, como pasteles, galletas, caramelos, papas fritas de bolsa y refrescos. Cmo se calculan los carbohidratos de los alimentos? Hay dos maneras de calcular los carbohidratos de los alimentos. Puede leer las etiquetas de los alimentos o aprender cules son los tamaos de las porciones estndar de los alimentos. Puede usar cualquiera de estos mtodos o una combinacin de [place]. Usar la Air cabin crew de informacin nutricional La lista de Informacin nutricional est incluida en las etiquetas de casi todas las bebidas y los alimentos envasados de los [place]. Esto incluye lo siguiente: El tamao de la porcin. Informacin sobre los nutrientes de cada porcin, incluidos los gramos de carbohidratos por porcin. Para usar la Informacin nutricional, decida cuntas porciones tomar. Luego, multiplique el nmero de porciones por la cantidad  de carbohidratos por porcin. El nmero resultante es la cantidad total de carbohidratos que comer. Conocer los tamaos de las porciones estndar de los alimentos Cuando coma alimentos que contengan carbohidratos y que no estn envasados o no incluyan la informacin nutricional en la etiqueta, debe medir las porciones para poder calcular los gramos de carbohidratos. Mida los alimentos que comer con una balanza de alimentos o una taza medidora, si es necesario. Decida cuntas porciones de Programmer, systems. Multiplique el nmero de porciones por 15. En los alimentos que contienen carbohidratos, una porcin [place] a 15 g de carbohidratos. Por ejemplo, si come 2 tazas o 10 onzas (300 g) de fresas, habr comido 2 porciones y 30 g de carbohidratos (2 porciones x 15 g = 30 g). En el caso de las comidas que contienen mezclas de ms de un alimento, como las sopas y los guisos, debe calcular los carbohidratos de cada alimento que se incluye. La siguiente lista contiene los tamaos de porciones estndar de los alimentos ricos en carbohidratos ms comunes. Cada una de estas porciones tiene aproximadamente 15 g de carbohidratos: 1 rebanada de pan. 1 tortilla de seis pulgadas (15 cm). ? de taza o 2 onzas (53 g) de arroz o pasta cocidos.  taza o 3 onzas (85 g) de lentejas o frijoles cocidos o enlatados, escurridos y enjuagados.  taza o 3 onzas (85 g) de verduras con almidn, como guisantes, maz o zapallo.  taza o 4 onzas (120 g) de cereal caliente.  taza o 3 onzas (85 g) de papas hervidas o en pur, o  o 3 onzas (85 g) de una papa grande al horno.  taza o 4 onzas fluidas (118 ml) de jugo de frutas. 1 taza u  8 onzas fluidas (237 ml) de leche. 1 unidad pequea o 4 onzas (106 g) de manzana.  unidad o 2 onzas (63 g) de una banana mediana. 1 taza o 5 oz (150 g) de fresas. 3 tazas o 1 oz (28.3 g) de palomitas de maz. Cul sera un ejemplo de recuento de carbohidratos? Para calcular los gramos  de carbohidratos de este ejemplo de comida, siga los pasos que se describen a continuacin. Ejemplo de comida 3 onzas (85 g) de pechugas de pollo. ? de taza o 4 onzas (106 g) de arroz integral.  taza o 3 onzas (85 g) de maz. 1 taza u 8 onzas fluidas (237 ml) de leche. 1 taza o 5 onzas (150 g) de fresas con crema batida sin azcar. Clculo de carbohidratos Identifique los alimentos que contienen carbohidratos: Arroz. Maz. Leche. Jinny Sanders. Calcule cuntas porciones come de cada alimento: 2 porciones de arroz. 1 porcin de maz. 1 porcin de leche. 1 porcin de fresas. Multiplique cada nmero de porciones por 15 g: 2 porciones de arroz x 15 g = 30 g. 1 porcin de maz x 15 g = 15 g. 1 porcin de leche x 15 g = 15 g. 1 porcin de fresas x 15 g = 15 g. Sume todas las cantidades para conocer el total de gramos de carbohidratos consumidos: 30 g + 15 g + 15 g + 15 g = 75 g de carbohidratos en total. Consejos para seguir este plan Al ir de compras [place] un plan de comidas y luego haga una lista de compras. Compre verduras frescas y congeladas, frutas frescas y congeladas, productos lcteos, huevos, frijoles, lentejas y cereales integrales. Fjese en las etiquetas de los alimentos. Elija los alimentos que tengan ms fibra y Neurosurgeon. Evite los alimentos procesados y los alimentos con Biochemist, clinical. Planificacin de las comidas Trate de consumir la misma cantidad de gramos de carbohidratos en cada comida y en cada colacin. Planifique tomar comidas y colaciones regulares y equilibradas. Dnde buscar ms informacin American Diabetes Association (Asociacin Estadounidense de la Diabetes): diabetes.org Centers for Disease Control and Prevention (Centros para el Control y la Prevencin de [place]): TonerPromos.no Academy of Nutrition and Dietetics (Academia de Nutricin y Pension scheme manager): Theme [email or web address].org Association of Diabetes Care & Education Specialists (Asociacin de Especialistas en  Atencin y Educacin sobre la Diabetes): diabeteseducator.org Resumen El recuento de carbohidratos es un mtodo para llevar un registro de la cantidad de carbohidratos que se ingieren. La ingesta de carbohidratos aumenta la cantidad de azcar (glucosa) en la sangre. El recuento de la cantidad de carbohidratos que ingiere mejora el control de su glucemia. Esto le ayuda a Chief Operating Officer su diabetes. Un nutricionista puede ayudarlo a crear un plan de alimentacin y a calcular la cantidad de carbohidratos que debe ingerir en cada comida y colacin. Esta informacin no tiene Theme park manager el consejo del mdico. Asegrese de hacerle al mdico cualquier pregunta que tenga. Document Revised: 07/21/2020 Document Reviewed: 07/21/2020 Elsevier Patient Education  2024 [employer].

## 2023-12-29 ENCOUNTER — Other Ambulatory Visit: Payer: Self-pay

## 2024-01-04 ENCOUNTER — Encounter: Payer: Self-pay | Admitting: Physician Assistant

## 2024-01-04 ENCOUNTER — Other Ambulatory Visit: Payer: Self-pay

## 2024-01-08 ENCOUNTER — Other Ambulatory Visit: Payer: Self-pay

## 2024-01-08 MED ORDER — DOXYCYCLINE HYCLATE 100 MG PO CAPS
100.0000 mg | ORAL_CAPSULE | Freq: Two times a day (BID) | ORAL | 0 refills | Status: AC
  Filled 2024-01-08: qty 28, 14d supply, fill #0

## 2024-01-09 ENCOUNTER — Other Ambulatory Visit (INDEPENDENT_AMBULATORY_CARE_PROVIDER_SITE_OTHER): Payer: Self-pay | Admitting: Physician Assistant

## 2024-01-09 DIAGNOSIS — E11622 Type 2 diabetes mellitus with other skin ulcer: Secondary | ICD-10-CM

## 2024-01-09 DIAGNOSIS — L97218 Non-pressure chronic ulcer of right calf with other specified severity: Secondary | ICD-10-CM

## 2024-01-10 ENCOUNTER — Ambulatory Visit (INDEPENDENT_AMBULATORY_CARE_PROVIDER_SITE_OTHER): Payer: Self-pay

## 2024-01-10 DIAGNOSIS — E11622 Type 2 diabetes mellitus with other skin ulcer: Secondary | ICD-10-CM

## 2024-01-10 DIAGNOSIS — L98499 Non-pressure chronic ulcer of skin of other sites with unspecified severity: Secondary | ICD-10-CM

## 2024-01-10 DIAGNOSIS — L97218 Non-pressure chronic ulcer of right calf with other specified severity: Secondary | ICD-10-CM

## 2024-01-11 ENCOUNTER — Other Ambulatory Visit: Payer: Self-pay

## 2024-01-11 LAB — VAS US ABI WITH/WO TBI
Left ABI: >1
Right ABI: >1

## 2024-01-17 ENCOUNTER — Other Ambulatory Visit: Payer: Self-pay

## 2024-01-17 MED ORDER — SILVER SULFADIAZINE 1 % EX CREA
1.0000 | TOPICAL_CREAM | Freq: Every day | CUTANEOUS | 3 refills | Status: AC
  Filled 2024-01-17: qty 50, 50d supply, fill #0

## 2024-01-18 ENCOUNTER — Emergency Department: Payer: Self-pay

## 2024-01-18 ENCOUNTER — Encounter: Payer: Self-pay | Attending: Physician Assistant | Admitting: Physician Assistant

## 2024-01-18 ENCOUNTER — Emergency Department
Admission: EM | Admit: 2024-01-18 | Discharge: 2024-01-18 | Disposition: A | Discharge status: Home / Self Care | Payer: Self-pay | Attending: Emergency Medicine | Admitting: Emergency Medicine

## 2024-01-18 ENCOUNTER — Ambulatory Visit: Payer: Self-pay

## 2024-01-18 ENCOUNTER — Other Ambulatory Visit: Payer: Self-pay

## 2024-01-18 DIAGNOSIS — L97218 Non-pressure chronic ulcer of right calf with other specified severity: Secondary | ICD-10-CM | POA: Insufficient documentation

## 2024-01-18 DIAGNOSIS — E1151 Type 2 diabetes mellitus with diabetic peripheral angiopathy without gangrene: Secondary | ICD-10-CM | POA: Insufficient documentation

## 2024-01-18 DIAGNOSIS — L03115 Cellulitis of right lower limb: Secondary | ICD-10-CM | POA: Insufficient documentation

## 2024-01-18 DIAGNOSIS — R55 Syncope and collapse: Secondary | ICD-10-CM | POA: Insufficient documentation

## 2024-01-18 DIAGNOSIS — E119 Type 2 diabetes mellitus without complications: Secondary | ICD-10-CM | POA: Insufficient documentation

## 2024-01-18 DIAGNOSIS — I10 Essential (primary) hypertension: Secondary | ICD-10-CM | POA: Insufficient documentation

## 2024-01-18 DIAGNOSIS — E11622 Type 2 diabetes mellitus with other skin ulcer: Secondary | ICD-10-CM | POA: Insufficient documentation

## 2024-01-18 LAB — CBC
HCT: 39.3 % (ref 36.0–46.0)
Hemoglobin: 12.9 g/dL (ref 12.0–15.0)
MCH: 28.9 pg (ref 26.0–34.0)
MCHC: 32.8 g/dL (ref 30.0–36.0)
MCV: 88.1 fL (ref 80.0–100.0)
Platelets: 136 10*3/uL — ABNORMAL LOW (ref 150–400)
RBC: 4.46 MIL/uL (ref 3.87–5.11)
RDW: 13.2 % (ref 11.5–15.5)
WBC: 5.9 10*3/uL (ref 4.0–10.5)
nRBC: 0 % (ref 0.0–0.2)

## 2024-01-18 LAB — GLUCOSE, CAPILLARY: Glucose-Capillary: 459 mg/dL — ABNORMAL HIGH (ref 70–99)

## 2024-01-18 LAB — BASIC METABOLIC PANEL
Anion gap: 10 (ref 5–15)
BUN: 17 mg/dL (ref 8–23)
CO2: 25 mmol/L (ref 22–32)
Calcium: 8.6 mg/dL — ABNORMAL LOW (ref 8.9–10.3)
Chloride: 97 mmol/L — ABNORMAL LOW (ref 98–111)
Creatinine, Ser: 1.14 mg/dL — ABNORMAL HIGH (ref 0.44–1.00)
GFR, Estimated: 50 mL/min — ABNORMAL LOW (ref >60)
Glucose, Bld: 363 mg/dL — ABNORMAL HIGH (ref 70–99)
Potassium: 4 mmol/L (ref 3.5–5.1)
Sodium: 132 mmol/L — ABNORMAL LOW (ref 135–145)

## 2024-01-18 LAB — CBG MONITORING, ED: Glucose-Capillary: 367 mg/dL — ABNORMAL HIGH (ref 70–99)

## 2024-01-18 LAB — TROPONIN I (HIGH SENSITIVITY)
Troponin I (High Sensitivity): 22 ng/L — ABNORMAL HIGH (ref <18)
Troponin I (High Sensitivity): 25 ng/L — ABNORMAL HIGH (ref <18)

## 2024-01-18 MED ORDER — SODIUM CHLORIDE 0.9 % IV BOLUS
1000.0000 mL | Freq: Once | INTRAVENOUS | Status: AC
  Administered 2024-01-18: 1000 mL via INTRAVENOUS

## 2024-01-18 NOTE — ED Triage Notes (Addendum)
 Pt to ED via POV c/o syncopal episode. Pt reports that around 10 am she was washing dishes when she suddenly felt dizzy. Pt tried to hold on to something when all she saw was black and fell backwards. Pt reports hitting head on the floor. Pt with hx of HTN and diabetes. Reports taking medication as prescribed. CBG 367

## 2024-01-18 NOTE — ED Notes (Signed)
 ED Provider at bedside.

## 2024-01-18 NOTE — ED Notes (Signed)
 Pt ambulated to restroom without assistance, gait steady. Denies dizziness, sob or cp

## 2024-01-18 NOTE — ED Provider Notes (Signed)
 [facility] Provider Note    Event Date/Time   First MD Initiated Contact with Patient 01/18/24 2042     (approximate)   History   Loss of Consciousness   HPI  Brianna Church is a 78 y.o. female who presents to the emergency department today because of concerns for syncopal episode.  Hospital interpreter is used for the interview.  Patient states that she was getting some coffee ready this morning in her kitchen when she started feeling like her vision was turning black.  She then passed out.  She does not think she hurt anything when she passed out.  She denies any chest pain or palpitations prior to passing out.  She was in her normal state of health yesterday.  Since the syncopal episode she states that he has been feeling fine today.  She denies having passed out in the past.     Physical Exam   Triage Vital Signs: ED Triage Vitals  Encounter Vitals Group     BP 01/18/24 1917 (!) 214/100     Systolic BP Percentile --      Diastolic BP Percentile --      Pulse Rate 01/18/24 1917 84     Resp 01/18/24 1917 18     Temp 01/18/24 1917 98.1 F (36.7 C)     Temp src --      SpO2 01/18/24 1917 100 %     Weight 01/18/24 1921 132 lb (59.9 kg)     Height --      Head Circumference --      Peak Flow --      Pain Score 01/18/24 1913 0     Pain Loc --      Pain Education --      Exclude from Growth Chart --     Most recent vital signs: Vitals:   01/18/24 1917 01/18/24 1920  BP: (!) 214/100 (!) 202/89  Pulse: 84   Resp: 18   Temp: 98.1 F (36.7 C)   SpO2: 100%    General: Awake, alert, oriented. CV:  Good peripheral perfusion. Regular rate and rhythm. Resp:  Normal effort. Lungs clear. Abd:  No distention.    ED Results / Procedures / Treatments   Labs (all labs ordered are listed, but only abnormal results are displayed) Labs Reviewed  BASIC METABOLIC PANEL - Abnormal; Notable for the following components:      Result Value   Sodium  132 (*)    Chloride 97 (*)    Glucose, Bld 363 (*)    Creatinine, Ser 1.14 (*)    Calcium 8.6 (*)    GFR, Estimated 50 (*)    All other components within normal limits  CBC - Abnormal; Notable for the following components:   Platelets 136 (*)    All other components within normal limits  CBG MONITORING, ED - Abnormal; Notable for the following components:   Glucose-Capillary 367 (*)    All other components within normal limits  TROPONIN I (HIGH SENSITIVITY) - Abnormal; Notable for the following components:   Troponin I (High Sensitivity) 22 (*)    All other components within normal limits     EKG  I, Phineas Semen, attending physician, personally viewed and interpreted this EKG  EKG Time: 1921 Rate: 83 Rhythm: normal sinus rhythm Axis: normal Intervals: qtc 434 QRS: narrow, q waves v1, v2, v3 ST changes: no st elevation Impression: abnormal ekg   RADIOLOGY I independently interpreted and visualized  the CT head/cervical spine. My interpretation: No ICH, No fracture Radiology interpretation:  IMPRESSION:  1. No acute intracranial abnormality. No calvarial fracture.  2. Mild senescent change. Small remote infarcts.  3. No acute fracture or listhesis of the cervical spine.       PROCEDURES:  Critical Care performed: No    MEDICATIONS ORDERED IN ED: Medications - No data to display   IMPRESSION / MDM / ASSESSMENT AND PLAN / ED COURSE  I reviewed the triage vital signs and the nursing notes.                              Differential diagnosis includes, but is not limited to, anemia, arrhythmia, ACS, electrolyte abnormality  Patient's presentation is most consistent with acute presentation with potential threat to life or bodily function.   The patient is on the cardiac monitor to evaluate for evidence of arrhythmia and/or significant heart rate changes.  Patient presents to the emergency department today after a syncopal episode.  On exam patient is  awake alert and oriented.  EKG without concerning arrhythmia. Blood work without anemia. Does show elevated glucose. Troponin minimally elevated, however per chart review it appears patient has had elevated troponins on recent checks. Will plan on rechecking today. Additionally will give IVFs.  Troponin without significant change. CT imaging without concerning findings. Patient continues to feel well. At this time I think it is reasonable for patient to be discharged. Discussed return precautions and importance of follow up with primary care.       FINAL CLINICAL IMPRESSION(S) / ED DIAGNOSES   Final diagnoses:  Syncope, unspecified syncope type     Note:  This document was prepared using Dragon voice recognition software and may include unintentional dictation errors.    Phineas Semen, MD 01/18/24 [phone]

## 2024-01-18 NOTE — ED Notes (Signed)
 Called lab and confirmed they have green top that was sent earlier.

## 2024-01-18 NOTE — ED Notes (Signed)
Green top sent to lab

## 2024-01-23 ENCOUNTER — Other Ambulatory Visit: Payer: Self-pay

## 2024-01-24 ENCOUNTER — Other Ambulatory Visit: Payer: Self-pay | Admitting: Gerontology

## 2024-01-24 ENCOUNTER — Other Ambulatory Visit: Payer: Self-pay

## 2024-01-24 ENCOUNTER — Encounter: Payer: Self-pay | Admitting: Physician Assistant

## 2024-01-24 ENCOUNTER — Ambulatory Visit: Payer: Self-pay | Admitting: Physician Assistant

## 2024-01-24 DIAGNOSIS — E1165 Type 2 diabetes mellitus with hyperglycemia: Secondary | ICD-10-CM

## 2024-01-25 ENCOUNTER — Other Ambulatory Visit: Payer: Self-pay | Admitting: Gerontology

## 2024-01-25 ENCOUNTER — Other Ambulatory Visit: Payer: Self-pay

## 2024-01-25 DIAGNOSIS — E1165 Type 2 diabetes mellitus with hyperglycemia: Secondary | ICD-10-CM

## 2024-01-25 MED FILL — Metformin HCl Tab ER 24HR 500 MG: ORAL | 30 days supply | Qty: 30 | Fill #0 | Status: CN

## 2024-01-31 ENCOUNTER — Ambulatory Visit: Payer: Self-pay | Admitting: Physician Assistant

## 2024-02-06 ENCOUNTER — Encounter: Payer: Self-pay | Admitting: Gerontology

## 2024-02-06 ENCOUNTER — Other Ambulatory Visit: Payer: Self-pay

## 2024-02-06 ENCOUNTER — Encounter: Payer: Self-pay | Admitting: Physician Assistant

## 2024-02-06 ENCOUNTER — Ambulatory Visit: Payer: Self-pay | Admitting: Gerontology

## 2024-02-06 VITALS — BP 191/102 | HR 76 | Wt 132.0 lb

## 2024-02-06 DIAGNOSIS — T148XXA Other injury of unspecified body region, initial encounter: Secondary | ICD-10-CM

## 2024-02-06 DIAGNOSIS — E1169 Type 2 diabetes mellitus with other specified complication: Secondary | ICD-10-CM

## 2024-02-06 DIAGNOSIS — R0789 Other chest pain: Secondary | ICD-10-CM | POA: Insufficient documentation

## 2024-02-06 DIAGNOSIS — R42 Dizziness and giddiness: Secondary | ICD-10-CM | POA: Insufficient documentation

## 2024-02-06 DIAGNOSIS — E119 Type 2 diabetes mellitus without complications: Secondary | ICD-10-CM | POA: Insufficient documentation

## 2024-02-06 DIAGNOSIS — G4700 Insomnia, unspecified: Secondary | ICD-10-CM

## 2024-02-06 DIAGNOSIS — E1159 Type 2 diabetes mellitus with other circulatory complications: Secondary | ICD-10-CM

## 2024-02-06 DIAGNOSIS — R0602 Shortness of breath: Secondary | ICD-10-CM | POA: Insufficient documentation

## 2024-02-06 DIAGNOSIS — I10 Essential (primary) hypertension: Secondary | ICD-10-CM | POA: Insufficient documentation

## 2024-02-06 DIAGNOSIS — E1165 Type 2 diabetes mellitus with hyperglycemia: Secondary | ICD-10-CM

## 2024-02-06 LAB — POCT GLYCOSYLATED HEMOGLOBIN (HGB A1C): Hemoglobin A1C: 10.7 % — AB (ref 4.0–5.6)

## 2024-02-06 LAB — GLUCOSE, POCT (MANUAL RESULT ENTRY): POC Glucose: 237 mg/dL — AB (ref 70–99)

## 2024-02-06 MED ORDER — BASAGLAR KWIKPEN 100 UNIT/ML ~~LOC~~ SOPN
15.0000 [IU] | PEN_INJECTOR | Freq: Every day | SUBCUTANEOUS | 2 refills | Status: AC
  Filled 2024-02-06 – 2024-02-09 (×2): qty 15, 100d supply, fill #0

## 2024-02-06 MED ORDER — METFORMIN HCL ER 500 MG PO TB24
1000.0000 mg | ORAL_TABLET | Freq: Every day | ORAL | 0 refills | Status: AC
  Filled 2024-02-06: qty 180, 90d supply, fill #0

## 2024-02-06 MED ORDER — PRAVASTATIN SODIUM 40 MG PO TABS
40.0000 mg | ORAL_TABLET | Freq: Every day | ORAL | 0 refills | Status: AC
  Filled 2024-02-06: qty 90, 90d supply, fill #0

## 2024-02-06 MED ORDER — BLOOD PRESSURE KIT
1.0000 | PACK | Freq: Every day | 0 refills | Status: AC
Start: 2024-02-06 — End: ?
  Filled 2024-02-06: qty 1, fill #0

## 2024-02-06 MED ORDER — IRBESARTAN 150 MG PO TABS
150.0000 mg | ORAL_TABLET | Freq: Every day | ORAL | 0 refills | Status: AC
  Filled 2024-02-06: qty 90, 90d supply, fill #0

## 2024-02-06 MED ORDER — TRAZODONE HCL 50 MG PO TABS
25.0000 mg | ORAL_TABLET | Freq: Every day | ORAL | 0 refills | Status: AC
  Filled 2024-02-06: qty 15, 30d supply, fill #0

## 2024-02-06 NOTE — Progress Notes (Signed)
 Established Patient Office Visit  Subjective   Patient ID: Brianna Church, female    DOB: [birth year]  Age: 78 y.o. MRN: [id number]  Chief Complaint  Patient presents with   Medication Refill    Medication Refill   Brianna Church is a 78 y/o female who has history of type 2 diabetes, hyperlipidemia, hypertension,presents to the clinic today requesting  medication refill.  She had a ED visit on 01/18/2024 for Syncope and an appointment with Brianna Mayotte III, PA today for wound care at [facility] for wound care with dressing to the right lower leg. Wound packing was not removed since it was changed at the wound clinic few hours prior to her appointment. No erythema no swelling was observed. She denies any pain or discomfort to the site with no reported issues. States she has been out of medications over a week and the pharmacy. She states she only has Metformin but she ran out of Irbesartan 75 mg, and Pravastatin a week ago. Her blood pressure was elevated during visit at 192/102,  she does not check her blood pressure at home. She denies chest pain, palpitation, dizziness and vision changes. Her HgbA1c checked during visit decreased from 11.5% to 10.7%. She did not bring her blood glucose log, states that she checks it bid. She states that her fasting readings are greater than 170 mg/dl and around noon 191-478 mg/dl. Office visit today, it was 237 mg/dl. States she is taking her insulin as prescribed, denies hypoglycemic symptoms, endorses polyuria. Her daughter was present during visit, states that she will be traveling with patient to her country and will be gone for 3 months.   Review of Systems  Constitutional: Negative.   Eyes:  Positive for blurred vision.  Respiratory: Negative.    Cardiovascular: Negative.   Gastrointestinal: Negative.   Genitourinary: Negative.   Musculoskeletal: Negative.   Skin: Negative.        Healing wound to ventral surface of  right lower leg, dressing intact  Neurological: Negative.   Endo/Heme/Allergies: Negative.   Psychiatric/Behavioral: Negative.        Objective:     BP (!) 191/102 (BP Location: Left Arm, Patient Position: Sitting, Cuff Size: Small)   Pulse 76   Wt 132 lb (59.9 kg)   SpO2 97%   BMI 24.94 kg/m  BP Readings from Last 3 Encounters:  02/06/24 (!) 191/102  01/18/24 (!) 182/86  12/28/23 (!) 175/82   Wt Readings from Last 3 Encounters:  02/06/24 132 lb (59.9 kg)  01/18/24 132 lb (59.9 kg)  12/28/23 123 lb 14.4 oz (56.2 kg)   SpO2 Readings from Last 3 Encounters:  02/06/24 97%  01/18/24 96%  12/28/23 96%      Physical Exam HENT:     Head: Normocephalic and atraumatic.     Mouth/Throat:     Mouth: Mucous membranes are moist.     Pharynx: Oropharynx is clear.  Eyes:     Extraocular Movements: Extraocular movements intact.     Pupils: Pupils are equal, round, and reactive to light.  Cardiovascular:     Rate and Rhythm: Normal rate and regular rhythm.     Pulses: Normal pulses.     Heart sounds: Normal heart sounds.  Pulmonary:     Effort: Pulmonary effort is normal.     Breath sounds: Normal breath sounds.  Skin:    General: Skin is warm and dry.     Comments: Healing wound to ventral surface  of right lower leg, dressing intact  Neurological:     General: No focal deficit present.     Mental Status: She is alert and oriented to person, place, and time.  Psychiatric:        Mood and Affect: Mood normal.        Behavior: Behavior normal.        Thought Content: Thought content normal.        Judgment: Judgment normal.      Results for orders placed or performed in visit on 02/06/24  POCT Glucose (CBG)  Result Value Ref Range   POC Glucose 237 (A) 70 - 99 mg/dl    Last CBC Lab Results  Component Value Date   WBC 5.9 01/18/2024   HGB 12.9 01/18/2024   HCT 39.3 01/18/2024   MCV 88.1 01/18/2024   MCH 28.9 01/18/2024   RDW 13.2 01/18/2024   PLT 136 (L)  01/18/2024   Last metabolic panel Lab Results  Component Value Date   GLUCOSE 363 (H) 01/18/2024   NA 132 (L) 01/18/2024   K 4.0 01/18/2024   CL 97 (L) 01/18/2024   CO2 25 01/18/2024   BUN 17 01/18/2024   CREATININE 1.14 (H) 01/18/2024   GFRNONAA 50 (L) 01/18/2024   CALCIUM 8.6 (L) 01/18/2024   PHOS 2.9 07/28/2022   PROT 7.6 11/27/2023   ALBUMIN 4.0 11/27/2023   LABGLOB 3.0 10/18/2023   AGRATIO 1.5 02/01/2018   BILITOT 0.8 11/27/2023   ALKPHOS 91 11/27/2023   AST 38 11/27/2023   ALT 28 11/27/2023   ANIONGAP 10 01/18/2024   Last lipids Lab Results  Component Value Date   CHOL 107 10/18/2023   HDL 50 10/18/2023   LDLCALC 34 10/18/2023   TRIG 129 10/18/2023   CHOLHDL 2.1 10/18/2023   Last hemoglobin A1c Lab Results  Component Value Date   HGBA1C 11.5 (A) 10/17/2023   Last thyroid functions Lab Results  Component Value Date   TSH 1.950 02/01/2018   Last vitamin D No results found for: "25OHVITD2", "25OHVITD3", "VD25OH" Last vitamin B12 and Folate No results found for: "VITAMINB12", "FOLATE"    The ASCVD Risk score (Arnett DK, et al., 2019) failed to calculate for the following reasons:   The valid total cholesterol range is 130 to 320 mg/dL    Assessment & Plan:  1. Uncontrolled type 2 diabetes mellitus with hyperglycemia (HCC) (Primary) Her diabetes is not controlled with her HgbA1c was 10.7%, her goal should be less than 7%.  Her current medication changed and she will check her blood glucose before breakfast, lunch, dinner and prior to bedtime- record and contact the office on Thursday (2 days) with the latest readings.  Her fasting blood glucose reading should be between 80-130 mg/dl. Her Glargine was increased to 15 units at bedtime and Metformin 1000 mg daily. Provider requested a delay in her travel out of the country for the next 3 months in order to regulate her glucose levels (declined). She was encouraged to continue on low carb/non concentrated sweet  diet and exercise as tolerated. - POCT Glucose (CBG) - POCT HgB A1C - Insulin Glargine (BASAGLAR KWIKPEN) 100 UNIT/ML; Inject 15 Units into the skin at bedtime.  Dispense: 15 mL; Refill: 2 - metFORMIN (GLUCOPHAGE-XR) 500 MG 24 hr tablet; Take 2 tablets (1,000 mg total) by mouth daily with breakfast.  Dispense: 180 tablet; Refill: 0   2. Hypertension associated with diabetes (HCC) Her blood pressure is not control with repeat readings 193/102 no  additional symptoms reported.  She was advised to take her medication as prescribed, DASH diet and exercise as tolerated. She was educated on stroke symptoms and the importance of seeking medical attention immediately with any related signs or symptoms. Medication was increased and she was educated accordingly. Her Avapro was increased to 150 mg daily, advised to go to the ED for signs and symptoms of Stroke, they verbalized understanding.  - irbesartan (AVAPRO) 150 MG tablet; Take 1 tablet (150 mg total) by mouth daily.  Dispense: 90 tablet; Refill: 0 - Blood Pressure KIT; 1 kit by Does not apply route daily.  Dispense: 1 kit; Refill: 0  3. Hyperlipidemia associated with type 2 diabetes mellitus (HCC) -She will continue on current medication, will recheck lipid panel on her next clinic visit, low fat/cholesterol diet and exercise as tolerated. - pravastatin (PRAVACHOL) 40 MG tablet; Take 1 tablet (40 mg total) by mouth daily.  Dispense: 90 tablet; Refill: 0  4. Insomnia, unspecified type -She was educated on small dosing to assist with sleep, consume early eating habits and avoid increased activities prior to bedtime.  - traZODone (DESYREL) 50 MG tablet; Take 0.5 tablets (25 mg total) by mouth at bedtime.  Dispense: 15 tablet; Refill: 0  5. Open wound -Examined wound to right lower leg with no redness or inflammation. Minimal serosanguinous drainage. Reapplied gauze dressing with paper tape to secure initial dressing. Pt tolerance good with no noted  issues.   Return in about 3 months (around 05/09/2024), or if symptoms worsen or fail to improve.    Elliot Cousin, RN

## 2024-02-06 NOTE — ED Triage Notes (Signed)
 Pt arrives via POV with CC of CP that started x3 days ago and has been worse at night. Reports dizziness and SOB. Hx of HTN and diabetes. Pt alert and oriented at this time.

## 2024-02-06 NOTE — Patient Instructions (Signed)
Plan de alimentacin DASH DASH Eating Plan DASH es la sigla en ingls de "Enfoques alimentarios para detener la hipertensin" (Dietary Approaches to Stop Hypertension). El plan de alimentacin DASH ha demostrado: Bajar la presin arterial alta (hipertensin). Reducir el riesgo de diabetes tipo 2, enfermedad cardaca y accidente cerebrovascular. Ayudar a perder peso. Consejos para seguir este plan Leer las etiquetas de los alimentos Verifique la cantidad de sal (sodio) por porcin en las etiquetas de los alimentos. Elija alimentos con menos del 5 por ciento del valor diario (VD) de sodio. En general, los alimentos con menos de 300 miligramos (mg) de sodio por porcin se encuadran dentro de este plan alimentario. Para encontrar cereales integrales, busque la palabra "integral" como primera palabra en la lista de ingredientes. Al ir de compras Compre productos en los que en su etiqueta diga: "bajo contenido de sodio" o "sin sal agregada". Compre alimentos frescos. Evite los alimentos enlatados y comidas precocidas o congeladas. Al cocinar Trate de no agregar sal cuando cocine. Use hierbas o aderezos sin sal, en lugar de sal de mesa o sal marina. Consulte al mdico o farmacutico antes de usar sustitutos de la sal. No fra los alimentos. A la hora de cocinar los alimentos, opte por hornearlos, hervirlos, grillarlos, asarlos al horno o a la parrilla. Cocine con aceites que sean buenos para el corazn. Estos incluyen el aceite de oliva, canola, aguacate, soja y girasol. Planificacin de las comidas  Siga una dieta equilibrada. Esto debe incluir lo siguiente: 4 o ms porciones de frutas y 4 o ms porciones de verduras por da. Trate de que medio plato de cada comida sea de frutas y verduras. De 6 a 8 porciones de cereales integrales todos los das. 6 o menos porciones de carne magra, ave o pescado por da. 1 onza es 1 porcin. Una porcin de 3 onzas (85 g) de carne tiene casi el mismo tamao que la  palma de la mano. Un huevo es 1 onza (28 g). De 2 a 3 porciones de productos lcteos descremados por da. Una porcin es 1 taza (237 ml). 1 porcin de frutos secos, semillas o frijoles 5 veces por semana. De 2 a 3 porciones de grasas cardiosaludables. Las grasas saludables llamadas cidos grasos omega-3 se encuentran en alimentos como las nueces, las semillas de lino, las leches fortificadas y los huevos. Estas grasas tambin se encuentran en los pescados de agua fra, como la sardina, el salmn y la caballa. Limite la cantidad que consume de: Alimentos enlatados o envasados. Alimentos con alto contenido de grasa trans, como alimentos fritos. Alimentos con alto contenido de grasa saturada, como carne con grasa. Postres y otros dulces, bebidas azucaradas y otros alimentos con azcar agregada. Productos lcteos enteros. No le agregue sal a los alimentos antes de probarlos. No coma ms de 4 yemas de huevo por semana. Trate de comer al menos 2 comidas vegetarianas por semana. Consuma ms comida casera y menos de restaurante, de bares y comida rpida. Estilo de vida Cuando coma en un restaurante, pida que preparen su comida con menos sal, o bien, sin nada de sal. Si bebe alcohol: Limite la cantidad que bebe a lo siguiente: De 0 a 1 medida al da si es mujer. De 0 a 2 medidas al da si es varn. Sepa cunta cantidad de alcohol hay en las bebidas que toma. En los [place], una medida es una botella de cerveza de 12 oz (355 ml), un vaso de vino de 5 oz (148 ml) o   un vaso de una bebida alcohlica de alta graduacin de 1 oz (44 ml). Informacin general Evite ingerir ms de 2,300 mg de sal por da. Si tiene hipertensin, es posible que necesite reducir la ingesta de sodio a 1,500 mg por da. Trabaje con el mdico para mantenerse en un peso saludable o para perder peso. Pregunte cul es el rango de peso recomendable para usted. La mayora de los das de la semana, realice al menos 30 minutos de  ejercicio que haga que se acelere su corazn. Estos pueden incluir caminar, nadar o andar en bicicleta. Trabaje con su mdico o nutricionista para ajustar su plan alimentario a sus necesidades calricas especficas. Qu alimentos debo comer? Frutas Todas las frutas frescas, congeladas o secas. Frutas enlatadas que estn en su jugo natural y que no tengan azcar agregada. Verduras Verduras frescas o congeladas crudas, cocidas al vapor, asadas o grilladas. Jugos de tomate y verduras con bajo contenido de sodio o reducidos en sodio. Salsa y pasta de tomate con bajo contenido de sodio o reducidas en sodio. Verduras enlatadas con bajo contenido de sodio o reducidas en sodio. Granos Pan de salvado o integral. Pasta de salvado o integral. Arroz integral. Avena. Quinua. Trigo burgol. Cereales integrales y con bajo contenido de sodio. Pan pita. Galletitas de agua con bajo contenido de grasa y sodio. Tortillas de harina integral. Carnes y otras protenas Pollo o pavo sin piel. Carne de pollo o de pavo molida. Cerdo desgrasado. Pescado y mariscos. Claras de huevo. Porotos, guisantes o lentejas secos. Frutos secos, mantequilla de frutos secos y semillas sin sal. Frijoles enlatados sin sal. Cortes de carne vacuna magra, desgrasada. Carne precocida o curada magra y baja en sodio, como embutidos o panes de carne. Lcteos Leche descremada (1 %) o descremada (desnatada). Quesos reducidos en grasa, con bajo contenido de grasa o descremados. Queso blanco o ricota sin grasa, con bajo contenido de sodio. Yogur semidescremado o descremado. Queso con bajo contenido de grasa y sodio. Grasas y aceites Margarinas untables que no contengan grasas trans. Aceite vegetal. Mayonesa y aderezos para ensaladas livianos, reducidos en grasa o con bajo contenido de grasas (reducidos en sodio). Aceite de canola, crtamo, oliva, aguacate, soja y girasol. Aguacate. Alios y condimentos Hierbas. Especias. Mezclas de condimentos sin  sal. Otros alimentos Palomitas de maz y pretzels sin sal. Dulces con bajo contenido de grasas. Es posible que los productos que se enumeran ms arriba no sean todos los alimentos y las bebidas que puede consumir. Consulte a un nutricionista para obtener ms informacin. Qu alimentos debo evitar? Frutas Fruta enlatada en almbar liviano o espeso. Frutas cocidas en aceite. Frutas con salsa de crema o mantequilla. Verduras Verduras con crema o fritas. Verduras en salsa de queso. Verduras enlatadas regulares que no sean con bajo contenido de sodio o reducidas en sodio. Pasta y salsa de tomates enlatadas regulares que no sean con bajo contenido de sodio o reducidas en sodio. Jugos de tomate y de verduras regulares que no sean con bajo contenido de sodio o reducidos en sodio. Pepinillos. Aceitunas. Granos Productos de panificacin hechos con grasa, como medialunas, magdalenas y algunos panes. Comidas con arroz o pasta seca listas para usar. Carnes y otras protenas Cortes de carne con alto contenido de grasa. Costillas. Carne frita. [place]ocino. [place]adela, salame y otras carnes precocidas o curadas, como embutidos o panes de carne, que no sean magros y que no sean con bajo contenido de sodio. Grasa de la espalda del cerdo (panceta). Salchicha de cerdo. Frutos secos   y semillas con sal. Frijoles enlatados con sal agregada. Pescado enlatado o ahumado. Huevos enteros o yemas. Pollo o pavo con piel. Lcteos Leche entera o al 2 %, crema y mitad leche y mitad crema. Queso crema entero o con toda su grasa. Yogur entero o endulzado. Quesos con toda su grasa. Sustitutos de cremas no lcteas. Coberturas batidas. Quesos para untar y quesos procesados. Grasas y aceites Mantequilla. Margarina en barra. Manteca de cerdo. Materia grasa. Mantequilla clarificada. Grasa de tocino. Aceites tropicales como aceite de coco, palmiste o palma. Alios y condimentos Sal de cebolla, sal de ajo, sal condimentada, sal de mesa y sal  marina. Salsa [place]. Salsa trtara. Salsa barbacoa. Salsa teriyaki. Salsa de soja, incluso la que tiene contenido reducido de sodio. Salsa de carne. Salsas en lata y envasadas. Salsa de pescado. Salsa de ostras. Salsa rosada. Rbanos picantes comprados en tiendas. [place]. Mostaza. Saborizantes y tiernizantes para carne. Caldo en cubitos. Salsas picantes. Adobos preelaborados o envasados. Aderezos para tacos preelaborados o envasados. Salsas. Aderezos comunes para ensalada. Otros alimentos Palomitas de maz y pretzels con sal. Es posible que los productos que se enumeran ms arriba no sean todos los alimentos y las bebidas que debe evitar. Consulte a un nutricionista para obtener ms informacin. Dnde obtener ms informacin National Heart, Lung, and Blood Institute (Instituto Nacional del Corazn, los Pulmones y la Sangre, NHLBI): nhlbi.nih.gov American Heart Association (Asociacin Estadounidense del Corazn, AHA): heart.org Academy of Nutrition and Dietetics (Academia de Nutricin y Diettica): eatright.org National Kidney Foundation (NKF) (Fundacin Nacional del Rin): kidney.org Esta informacin no tiene como fin reemplazar el consejo del mdico. Asegrese de hacerle al mdico cualquier pregunta que tenga. Document Revised: 12/09/2022 Document Reviewed: 12/09/2022 Elsevier Patient Education  2024 Elsevier Inc. Recuento de carbohidratos para la diabetes mellitus en los adultos Carbohydrate Counting for Diabetes Mellitus, Adult El recuento de carbohidratos es un mtodo para llevar un registro de la cantidad de carbohidratos que se ingieren. La ingesta de carbohidratos aumenta la cantidad de azcar (glucosa) en la sangre. El recuento de la cantidad de carbohidratos que ingiere mejora el control de su glucemia. Esto, a su vez, le ayuda a controlar su diabetes. Los carbohidratos se miden en gramos (g) por porcin. Es importante saber la cantidad de carbohidratos (en gramos o por tamao de  porcin) que se puede ingerir en cada comida. Esto es diferente en cada persona. Un nutricionista puede ayudarlo a crear un plan de alimentacin y a calcular la cantidad de carbohidratos que debe ingerir en cada comida y colacin. Qu alimentos contienen carbohidratos? Los siguientes alimentos incluyen carbohidratos: Granos, como panes y cereales. Frijoles secos y productos con soja. Verduras con almidn, como papas, guisantes y maz. Frutas y jugos de frutas. Leche y yogur. Dulces y colaciones, como pasteles, galletas, caramelos, papas fritas de bolsa y refrescos. Cmo se calculan los carbohidratos de los alimentos? Hay dos maneras de calcular los carbohidratos de los alimentos. Puede leer las etiquetas de los alimentos o aprender cules son los tamaos de las porciones estndar de los alimentos. Puede usar cualquiera de estos mtodos o una combinacin de ambos. Usar la etiqueta de informacin nutricional La lista de Informacin nutricional est incluida en las etiquetas de casi todas las bebidas y los alimentos envasados de los [place]. Esto incluye lo siguiente: El tamao de la porcin. Informacin sobre los nutrientes de cada porcin, incluidos los gramos de carbohidratos por porcin. Para usar la Informacin nutricional, decida cuntas porciones tomar. Luego, multiplique el nmero de porciones por la   cantidad de carbohidratos por porcin. El nmero resultante es la cantidad total de carbohidratos que comer. Conocer los tamaos de las porciones estndar de los alimentos Cuando coma alimentos que contengan carbohidratos y que no estn envasados o no incluyan la informacin nutricional en la etiqueta, debe medir las porciones para poder calcular los gramos de carbohidratos. Mida los alimentos que comer con una balanza de alimentos o una taza medidora, si es necesario. Decida cuntas porciones de tamao estndar comer. Multiplique el nmero de porciones por 15. En los alimentos que  contienen carbohidratos, una porcin equivale a 15 g de carbohidratos. Por ejemplo, si come 2 tazas o 10 onzas (300 g) de fresas, habr comido 2 porciones y 30 g de carbohidratos (2 porciones x 15 g = 30 g). En el caso de las comidas que contienen mezclas de ms de un alimento, como las sopas y los guisos, debe calcular los carbohidratos de cada alimento que se incluye. La siguiente lista contiene los tamaos de porciones estndar de los alimentos ricos en carbohidratos ms comunes. Cada una de estas porciones tiene aproximadamente 15 g de carbohidratos: 1 rebanada de pan. 1 tortilla de seis pulgadas (15 cm). ? de taza o 2 onzas (53 g) de arroz o pasta cocidos.  taza o 3 onzas (85 g) de lentejas o frijoles cocidos o enlatados, escurridos y enjuagados.  taza o 3 onzas (85 g) de verduras con almidn, como guisantes, maz o zapallo.  taza o 4 onzas (120 g) de cereal caliente.  taza o 3 onzas (85 g) de papas hervidas o en pur, o  o 3 onzas (85 g) de una papa grande al horno.  taza o 4 onzas fluidas (118 ml) de jugo de frutas. 1 taza u 8 onzas fluidas (237 ml) de leche. 1 unidad pequea o 4 onzas (106 g) de manzana.  unidad o 2 onzas (63 g) de una banana mediana. 1 taza o 5 oz (150 g) de fresas. 3 tazas o 1 oz (28.3 g) de palomitas de maz. Cul sera un ejemplo de recuento de carbohidratos? Para calcular los gramos de carbohidratos de este ejemplo de comida, siga los pasos que se describen a continuacin. Ejemplo de comida 3 onzas (85 g) de pechugas de pollo. ? de taza o 4 onzas (106 g) de arroz integral.  taza o 3 onzas (85 g) de maz. 1 taza u 8 onzas fluidas (237 ml) de leche. 1 taza o 5 onzas (150 g) de fresas con crema batida sin azcar. Clculo de carbohidratos Identifique los alimentos que contienen carbohidratos: Arroz. Maz. Leche. Fresas. Calcule cuntas porciones come de cada alimento: 2 porciones de arroz. 1 porcin de maz. 1 porcin de leche. 1 porcin de  fresas. Multiplique cada nmero de porciones por 15 g: 2 porciones de arroz x 15 g = 30 g. 1 porcin de maz x 15 g = 15 g. 1 porcin de leche x 15 g = 15 g. 1 porcin de fresas x 15 g = 15 g. Sume todas las cantidades para conocer el total de gramos de carbohidratos consumidos: 30 g + 15 g + 15 g + 15 g = 75 g de carbohidratos en total. Consejos para seguir este plan Al ir de compras Elabore un plan de comidas y luego haga una lista de compras. Compre verduras frescas y congeladas, frutas frescas y congeladas, productos lcteos, huevos, frijoles, lentejas y cereales integrales. Fjese en las etiquetas de los alimentos. Elija los alimentos que tengan ms fibra y menos azcar.   Evite los alimentos procesados y los alimentos con azcares agregados. Planificacin de las comidas Trate de consumir la misma cantidad de gramos de carbohidratos en cada comida y en cada colacin. Planifique tomar comidas y colaciones regulares y equilibradas. Dnde buscar ms informacin American Diabetes Association (Asociacin Estadounidense de la Diabetes): diabetes.org Centers for Disease Control and Prevention (Centros para el Control y la Prevencin de Enfermedades): cdc.gov Academy of Nutrition and Dietetics (Academia de Nutricin y Diettica): eatright.org Association of Diabetes Care & Education Specialists (Asociacin de Especialistas en Atencin y Educacin sobre la Diabetes): diabeteseducator.org Resumen El recuento de carbohidratos es un mtodo para llevar un registro de la cantidad de carbohidratos que se ingieren. La ingesta de carbohidratos aumenta la cantidad de azcar (glucosa) en la sangre. El recuento de la cantidad de carbohidratos que ingiere mejora el control de su glucemia. Esto le ayuda a controlar su diabetes. Un nutricionista puede ayudarlo a crear un plan de alimentacin y a calcular la cantidad de carbohidratos que debe ingerir en cada comida y colacin. Esta informacin no tiene como  fin reemplazar el consejo del mdico. Asegrese de hacerle al mdico cualquier pregunta que tenga. Document Revised: 07/21/2020 Document Reviewed: 07/21/2020 Elsevier Patient Education  2024 Elsevier Inc.  

## 2024-02-07 ENCOUNTER — Emergency Department: Payer: Self-pay

## 2024-02-07 ENCOUNTER — Emergency Department
Admission: EM | Admit: 2024-02-07 | Discharge: 2024-02-07 | Disposition: A | Discharge status: Home / Self Care | Payer: Self-pay | Attending: Emergency Medicine | Admitting: Emergency Medicine

## 2024-02-07 DIAGNOSIS — I10 Essential (primary) hypertension: Secondary | ICD-10-CM

## 2024-02-07 DIAGNOSIS — R0789 Other chest pain: Secondary | ICD-10-CM

## 2024-02-07 DIAGNOSIS — R42 Dizziness and giddiness: Secondary | ICD-10-CM

## 2024-02-07 LAB — COMPREHENSIVE METABOLIC PANEL
ALT: 16 U/L (ref 0–44)
AST: 29 U/L (ref 15–41)
Albumin: 3.7 g/dL (ref 3.5–5.0)
Alkaline Phosphatase: 83 U/L (ref 38–126)
Anion gap: 7 (ref 5–15)
BUN: 15 mg/dL (ref 8–23)
CO2: 23 mmol/L (ref 22–32)
Calcium: 8.6 mg/dL — ABNORMAL LOW (ref 8.9–10.3)
Chloride: 105 mmol/L (ref 98–111)
Creatinine, Ser: 1.06 mg/dL — ABNORMAL HIGH (ref 0.44–1.00)
GFR, Estimated: 54 mL/min — ABNORMAL LOW (ref >60)
Glucose, Bld: 179 mg/dL — ABNORMAL HIGH (ref 70–99)
Potassium: 4.1 mmol/L (ref 3.5–5.1)
Sodium: 135 mmol/L (ref 135–145)
Total Bilirubin: 0.8 mg/dL (ref 0.0–1.2)
Total Protein: 7.5 g/dL (ref 6.5–8.1)

## 2024-02-07 LAB — CBC WITH DIFFERENTIAL/PLATELET
Abs Immature Granulocytes: 0.01 10*3/uL (ref 0.00–0.07)
Basophils Absolute: 0.1 10*3/uL (ref 0.0–0.1)
Basophils Relative: 1 %
Eosinophils Absolute: 0.2 10*3/uL (ref 0.0–0.5)
Eosinophils Relative: 3 %
HCT: 38.7 % (ref 36.0–46.0)
Hemoglobin: 12.5 g/dL (ref 12.0–15.0)
Immature Granulocytes: 0 %
Lymphocytes Relative: 30 %
Lymphs Abs: 1.5 10*3/uL (ref 0.7–4.0)
MCH: 28.4 pg (ref 26.0–34.0)
MCHC: 32.3 g/dL (ref 30.0–36.0)
MCV: 88 fL (ref 80.0–100.0)
Monocytes Absolute: 0.4 10*3/uL (ref 0.1–1.0)
Monocytes Relative: 8 %
Neutro Abs: 2.9 10*3/uL (ref 1.7–7.7)
Neutrophils Relative %: 58 %
Platelets: 141 10*3/uL — ABNORMAL LOW (ref 150–400)
RBC: 4.4 MIL/uL (ref 3.87–5.11)
RDW: 13.6 % (ref 11.5–15.5)
WBC: 5.1 10*3/uL (ref 4.0–10.5)
nRBC: 0 % (ref 0.0–0.2)

## 2024-02-07 LAB — CBG MONITORING, ED: Glucose-Capillary: 113 mg/dL — ABNORMAL HIGH (ref 70–99)

## 2024-02-07 LAB — TROPONIN I (HIGH SENSITIVITY)
Troponin I (High Sensitivity): 22 ng/L — ABNORMAL HIGH (ref <18)
Troponin I (High Sensitivity): 26 ng/L — ABNORMAL HIGH (ref <18)

## 2024-02-07 MED ORDER — ACETAMINOPHEN 325 MG PO TABS
650.0000 mg | ORAL_TABLET | Freq: Once | ORAL | Status: AC
  Administered 2024-02-07: 650 mg via ORAL
  Filled 2024-02-07: qty 2

## 2024-02-07 MED ORDER — CLONIDINE HCL 0.1 MG PO TABS
0.2000 mg | ORAL_TABLET | Freq: Once | ORAL | Status: AC
  Administered 2024-02-07: 0.2 mg via ORAL
  Filled 2024-02-07: qty 2

## 2024-02-07 MED ORDER — BACITRACIN ZINC 500 UNIT/GM EX OINT
TOPICAL_OINTMENT | Freq: Once | CUTANEOUS | Status: AC
  Administered 2024-02-07: 2 via TOPICAL
  Filled 2024-02-07: qty 1.8

## 2024-02-07 NOTE — ED Provider Notes (Signed)
 [facility] Provider Note    Event Date/Time   First MD Initiated Contact with Patient 02/07/24 [phone]     (approximate)   History   Chest Pain   HPI  HPI obtained via video interpreter  Brianna Church is a 78 y.o. female with history of type 2 diabetes, hypertension, and hyperlipidemia who presents with episodes of dizziness, acute onset when she wakes up, associated with shortness of breath.  She also has some chest discomfort at the same time.  She states that the symptoms have now resolved and she is feeling better.  The patient had been out of her blood pressure medications and saw the doctor yesterday.  She was restarted on her blood pressure medications and also was prescribed insulin, but has not picked up these medications yet.  She denies any acute symptoms at this time.  I reviewed the past medical records.  The patient was seen by primary care yesterday for medication refill for her statin and blood pressure medications.  Her hemoglobin A1c was elevated and she was started on insulin glargine.   Physical Exam   Triage Vital Signs: ED Triage Vitals  Encounter Vitals Group     BP 02/06/24 2350 (!) 216/114     Systolic BP Percentile --      Diastolic BP Percentile --      Pulse Rate 02/06/24 2350 80     Resp 02/06/24 2350 16     Temp 02/06/24 2350 98.2 F (36.8 C)     Temp Source 02/06/24 2350 Oral     SpO2 02/06/24 2350 97 %     Weight 02/06/24 2352 136 lb (61.7 kg)     Height 02/06/24 2352 5\' 2"  (1.575 m)     Head Circumference --      Peak Flow --      Pain Score 02/06/24 2351 8     Pain Loc --      Pain Education --      Exclude from Growth Chart --     Most recent vital signs: Vitals:   02/07/24 0544 02/07/24 0544  BP:  (!) 150/82  Pulse:  77  Resp:  18  Temp: 98.2 F (36.8 C)   SpO2:  100%     General: Alert, well-appearing, no distress.  CV:  Good peripheral perfusion.  Resp:  Normal effort.  Abd:  No distention.   Other:  No peripheral edema.   ED Results / Procedures / Treatments   Labs (all labs ordered are listed, but only abnormal results are displayed) Labs Reviewed  CBC WITH DIFFERENTIAL/PLATELET - Abnormal; Notable for the following components:      Result Value   Platelets 141 (*)    All other components within normal limits  COMPREHENSIVE METABOLIC PANEL - Abnormal; Notable for the following components:   Glucose, Bld 179 (*)    Creatinine, Ser 1.06 (*)    Calcium 8.6 (*)    GFR, Estimated 54 (*)    All other components within normal limits  CBG MONITORING, ED - Abnormal; Notable for the following components:   Glucose-Capillary 113 (*)    All other components within normal limits  TROPONIN I (HIGH SENSITIVITY) - Abnormal; Notable for the following components:   Troponin I (High Sensitivity) 22 (*)    All other components within normal limits  TROPONIN I (HIGH SENSITIVITY) - Abnormal; Notable for the following components:   Troponin I (High Sensitivity) 26 (*)    All  other components within normal limits     EKG  ED ECG REPORT I, Dionne Bucy, the attending physician, personally viewed and interpreted this ECG.  Date: 02/06/2024 EKG Time: 2351 Rate: 79 Rhythm: normal sinus rhythm QRS Axis: normal Intervals: normal ST/T Wave abnormalities: Nonspecific T wave abnormalities Narrative Interpretation: no evidence of acute ischemia    RADIOLOGY  Chest x-ray: I independently viewed and interpreted the images; there is no focal consolidation or edema  PROCEDURES:  Critical Care performed: No  Procedures   MEDICATIONS ORDERED IN ED: Medications  cloNIDine (CATAPRES) tablet 0.2 mg (0.2 mg Oral Given 02/07/24 0504)  acetaminophen (TYLENOL) tablet 650 mg (650 mg Oral Given 02/07/24 0526)  bacitracin ointment (2 Applications Topical Given 02/07/24 0527)     IMPRESSION / MDM / ASSESSMENT AND PLAN / ED COURSE  I reviewed the triage vital signs and the nursing  notes.  78 year old female with PMH as noted above presents with episodes of dizziness associated with shortness of breath and chest discomfort in the last few days.  On exam the patient is significantly hypertensive.  Other vital signs are normal.  She is currently asymptomatic and appears quite well.  EKG shows no specific ischemic findings.  Chest x-ray is clear.  Initial troponin is borderline, but appears to be baseline for the patient.  CMP shows no acute findings.  CBC shows no leukocytosis or anemia.  Differential diagnosis includes, but is not limited to, hypertension, hyper or hypoglycemia, other metabolic disturbance, musculoskeletal pain, GERD, less likely ACS.  There is no clinical evidence of PE, aortic dissection, or other vascular etiology given that the pain has resolved.  The patient has no tachycardia or hypoxia.  Patient's presentation is most consistent with acute presentation with potential threat to life or bodily function.  The patient is on the cardiac monitor to evaluate for evidence of arrhythmia and/or significant heart rate changes.  We will obtain a repeat troponin.  I have also ordered clonidine for rapid treatment of the blood pressure.  If the repeat troponin is not elevated, the patient will be stable for discharge home.  She needs to fill the prescriptions and restarted on her blood pressure and diabetes medications; I emphasized the importance of this to the patient via the video interpreter.  ----------------------------------------- 6:58 AM on 02/07/2024 -----------------------------------------  The patient's blood pressure is markedly improved.  She is stable for discharge home at this time.  Return precautions have been provided.     FINAL CLINICAL IMPRESSION(S) / ED DIAGNOSES   Final diagnoses:  Hypertension, unspecified type  Dizziness  Atypical chest pain     Rx / DC Orders   ED Discharge Orders     None        Note:  This document  was prepared using Dragon voice recognition software and may include unintentional dictation errors.    Dionne Bucy, MD 02/07/24 [phone]

## 2024-02-07 NOTE — Discharge Instructions (Signed)
 Debe surtir las recetas de su mdico para la presin arterial y la diabetes y comenzar a Psychologist, forensic. Regrese a urgencias si presenta dolor de pecho, mareos o cualquier otro sntoma nuevo o que empeore y [employer].

## 2024-02-08 ENCOUNTER — Encounter: Payer: Self-pay | Admitting: Gerontology

## 2024-02-08 ENCOUNTER — Ambulatory Visit: Payer: Self-pay | Admitting: Gerontology

## 2024-02-08 ENCOUNTER — Other Ambulatory Visit: Payer: Self-pay

## 2024-02-08 VITALS — BP 158/84 | HR 77 | Wt 126.0 lb

## 2024-02-08 DIAGNOSIS — Z794 Long term (current) use of insulin: Secondary | ICD-10-CM

## 2024-02-08 DIAGNOSIS — E1159 Type 2 diabetes mellitus with other circulatory complications: Secondary | ICD-10-CM

## 2024-02-08 LAB — GLUCOSE, POCT (MANUAL RESULT ENTRY): POC Glucose: 233 mg/dL — AB (ref 70–99)

## 2024-02-08 MED ORDER — METFORMIN HCL ER 500 MG PO TB24
1000.0000 mg | ORAL_TABLET | Freq: Every day | ORAL | 0 refills | Status: AC
  Filled 2024-02-08: qty 180, 90d supply, fill #0

## 2024-02-08 NOTE — Progress Notes (Signed)
 Established Patient Office Visit  Subjective   Patient ID: Brianna Church, female    DOB: 19-Oct-1946  Age: 78 y.o. MRN: [id number]  Chief Complaint  Patient presents with   Follow-up   [employer] Language interpreter was used during visit. HPI  Brianna Church is a 78 y/o female who has history of type 2 diabetes, hyperlipidemia, hypertension,presents for follow up. Daughter reports that she has not picked up her 150 mg Avapro, and her blood pressure has improved since her last visit and ED visit. She brought her log and has a blood pressure reading of 149/82 and HR  was 66 and blood glucose was 276 mg/dl. Her blood pressure during visit was 158/84 and her blood glucose during visit was 233 mg/dl. She denies chest pain, palpitation, dizziness and vision changes, hypo/hyperglycemic symptoms and peripheral neuropathy.  Daughter states that she will be traveling with patient to [place] for 3 months. She decline deferring traveling for blood glucose and blood pressure to be stabilized. Overall, she states that she's doing well and offers no further complaints.  Review of Systems  Constitutional: Negative.   Eyes: Negative.   Respiratory: Negative.    Cardiovascular: Negative.   Gastrointestinal: Negative.   Skin:        Healing wound to ventral surface of right lower leg  Neurological: Negative.   Endo/Heme/Allergies: Negative.   Psychiatric/Behavioral: Negative.  Negative for depression.       Objective:     BP (!) 158/84 (BP Location: Right Arm, Patient Position: Sitting, Cuff Size: Small)   Pulse 77   Wt 126 lb (57.2 kg)   SpO2 96%   BMI 23.05 kg/m  BP Readings from Last 3 Encounters:  02/08/24 (!) 158/84  02/07/24 127/80  02/06/24 (!) 191/102   Wt Readings from Last 3 Encounters:  02/08/24 126 lb (57.2 kg)  02/06/24 136 lb (61.7 kg)  02/06/24 132 lb (59.9 kg)      Physical Exam HENT:     Head: Normocephalic and atraumatic.     Mouth/Throat:     Mouth: Mucous  membranes are moist.     Pharynx: Oropharynx is clear.  Eyes:     Extraocular Movements: Extraocular movements intact.     Conjunctiva/sclera: Conjunctivae normal.     Pupils: Pupils are equal, round, and reactive to light.  Cardiovascular:     Rate and Rhythm: Normal rate and regular rhythm.     Pulses: Normal pulses.     Heart sounds: Normal heart sounds.  Pulmonary:     Effort: Pulmonary effort is normal.     Breath sounds: Normal breath sounds.  Skin:    Capillary Refill: Capillary refill takes less than 2 seconds.     Comments: Healing wound to ventral surface of right lower leg  Neurological:     General: No focal deficit present.     Mental Status: She is alert and oriented to person, place, and time.  Psychiatric:        Mood and Affect: Mood normal.        Behavior: Behavior normal.        Thought Content: Thought content normal.        Judgment: Judgment normal.      Results for orders placed or performed in visit on 02/08/24  POCT Glucose (CBG)  Result Value Ref Range   POC Glucose 233 (A) 70 - 99 mg/dl    Last CBC Lab Results  Component Value Date   WBC 5.1 02/06/2024  HGB 12.5 02/06/2024   HCT 38.7 02/06/2024   MCV 88.0 02/06/2024   MCH 28.4 02/06/2024   RDW 13.6 02/06/2024   PLT 141 (L) 02/06/2024   Last metabolic panel Lab Results  Component Value Date   GLUCOSE 179 (H) 02/06/2024   NA 135 02/06/2024   K 4.1 02/06/2024   CL 105 02/06/2024   CO2 23 02/06/2024   BUN 15 02/06/2024   CREATININE 1.06 (H) 02/06/2024   GFRNONAA 54 (L) 02/06/2024   CALCIUM 8.6 (L) 02/06/2024   PHOS 2.9 07/28/2022   PROT 7.5 02/06/2024   ALBUMIN 3.7 02/06/2024   LABGLOB 3.0 10/18/2023   AGRATIO 1.5 02/01/2018   BILITOT 0.8 02/06/2024   ALKPHOS 83 02/06/2024   AST 29 02/06/2024   ALT 16 02/06/2024   ANIONGAP 7 02/06/2024   Last lipids Lab Results  Component Value Date   CHOL 107 10/18/2023   HDL 50 10/18/2023   LDLCALC 34 10/18/2023   TRIG 129 10/18/2023    CHOLHDL 2.1 10/18/2023   Last hemoglobin A1c Lab Results  Component Value Date   HGBA1C 10.7 (A) 02/06/2024   Last thyroid functions Lab Results  Component Value Date   TSH 1.950 02/01/2018   Last vitamin D No results found for: "25OHVITD2", "25OHVITD3", "VD25OH" Last vitamin B12 and Folate No results found for: "VITAMINB12", "FOLATE"    The ASCVD Risk score (Arnett DK, et al., 2019) failed to calculate for the following reasons:   The valid total cholesterol range is 130 to 320 mg/dL    Assessment & Plan:   1. Type 2 diabetes mellitus with hyperglycemia, with long-term current use of insulin (HCC) - Her diabetes is not controlled, her goal HgbA1c should be less than 8%.  She was provided with metformin and insulin to last for 3 months since daughter declined changing her flight ticket and was bent on traveling on 02/13/24. She was encouraged to check her blood glucose, record and bring log to follow up appointment and to go to the Hospital with worsening symptoms. - POCT Glucose (CBG) - metFORMIN (GLUCOPHAGE-XR) 500 MG 24 hr tablet; Take 2 tablets (1,000 mg total) by mouth daily with breakfast.  Dispense: 180 tablet; Refill: 0  2. Hypertension associated with diabetes (HCC) (Primary) - Her blood pressure is decreasing though not at goal. Daughter was encouraged to pick up new medication, check her blood pressure at least every other day. She was educated on the signs and symptoms of stroke and advised to go to the ED. She was advised to continue on DASH diet, non concentrated sweet diet and exercise as tolerated,daughter declined changing her flight ticket and was bent on traveling on 02/13/24. She was advised to go to the ED for worsening symptoms.    Return in about 3 months (around 05/07/2024), or if symptoms worsen or fail to improve.    Ara Grandmaison Trellis Paganini, NP

## 2024-02-08 NOTE — Patient Instructions (Signed)
Cocinar con menos sal Cooking With Less Salt Cocinar con menos sal es una manera de reducir la cantidad de sal (sodio) que los alimentos le aportan. [place] [employer] de las personas debe consumir menos de 2,300 miligramos (mg) de Pharmacist, community. Si tiene presin arterial alta (hipertensin), es posible que deba limitar el consumo de sodio a 1,500 mg por da. Siga los consejos a continuacin que lo ayudarn a Careers information officer consumo de [place]. Cules son los consejos para consumir menos sodio? Leer las [employer] de los alimentos  Revise la etiqueta del alimento antes de comprar o usar ingredientes envasados. Revise siempre la etiqueta para conocer el tamao de la porcin y el contenido de [place]. Elija productos con menos de 140 mg de sodio por porcin. Verifique la columna de valor porcentual diario para ver qu porcentaje de la cantidad diaria recomendada de sodio contiene una porcin del producto. Los alimentos con un 5 % o menos tienen bajo contenido de [place]. Los alimentos con un 20 % o ms tienen alto contenido de [place]. No elija los alimentos que tengan sal como uno de los tres primeros ingredientes en la lista de ingredientes. Siempre controle la cantidad de sodio que contiene un producto, incluso si la etiqueta dice "sin sal" o "sin sal agregada". Al ir de compras Compre productos sin sodio o con bajo contenido de sodio. Busque estas palabras: Bajo contenido de [place]. Sin sodio. Reducido en sodio. Sin agregado de sal. Sin sal. Compre alimentos frescos o congelados sin salsas ni aditivos. Al cocinar En lugar de la sal, use hierbas, condimentos sin sal y especias. Para hornear, use bicarbonato que no contenga sodio. Cocine los alimentos grillados, en estofado o a la parrilla para agregarles sabor utilizando menos sal. No agregue sal a las pastas, el arroz y los cereales calientes. Escurra y enjuague las verduras, los frijoles y las carnes en latas antes de consumir estos alimentos. No agregue sal  cuando cocine dulces y postres. Cocine con ingredientes con bajo contenido de sodio. Planificacin de las comidas El sodio del pan puede acumularse. Trate de planificar comidas con otros cereales. Estos pueden incluir avena entera, quinua, pasta integral y otros cereales integrales que no tengan sodio agregado. Qu alimentos tienen alto contenido de sodio? Verduras Verduras enlatadas regulares, excepto aquellas que sean con bajo contenido de sodio o reducidas en sodio. Chucrut, verduras en escabeche y salsas. Aceitunas. Papas fritas. Aros de cebolla. Pasta y salsa de tomate en lata comunes. Jugos comunes de tomate y de verduras. Verduras Hydrologist. Granos Cereales instantneos para comer caliente. Mezclas para bizcochos, panqueques y rellenos de pan. Crutones. Mezclas para pastas o arroz con condimento. Envases comerciales de sopa de fideos. Macarrones con queso envasados o congelados. Galletas saladas comunes. Harina leudante. Bollos. Bagels. Tortillas y wraps de [place]. Carnes y [address] de vaca o pescado que est salada, [place], [place], curada, condimentada o en escabeche. Carne precocinada o curada, como embutidos o panes de carne. Tocino. Jamn. Pepperoni. Perros calientes. Carne en conserva. Carne picada de buey. Cerdo salado. Cecina o charqui. Arenque en escabeche, anchoas y sardinas. Atn enlatado comn. Frutos secos con sal. Celine Mans para untar y quesos procesados. Quesos duros. Requesn. Queso azul. Queso feta. Queso en hebras. Queso cottage comn. Suero de [place]. Leche enlatada. Es posible que los productos enumerados anteriormente no sean una lista completa de los alimentos con alto contenido de [place]. Consulte a un nutricionista para obtener ms informacin. Cules son los alimentos con bajo contenido de  sodio? Nils Pyle Frutas frescas, congeladas o enlatadas sin agregado de salsa. Jugo de frutas. Verduras Verduras frescas o congeladas sin agregado de  salsa. Verduras enlatadas "sin sal agregada". Salsa de tomate y pastas "sin sal agregada". Jugos de tomate y verduras con bajo contenido de sodio o reducidos en sodio. Granos Fideos, pastas, quinua, arroz. Trigo triturado o inflado, o arroz inflado. Avena comn o rpida (no instantnea). Galletas con bajo contenido de [place]. Pan con bajo contenido de sodio. Panes y pastas integrales. Palomitas de maz sin sal. Carnes y otras protenas Carnes y aves, sin sodio inyectado, y pescado frescos o congelados sin agregado de Artist. Frutos secos sin sal. Lentejas, frijoles y guisantes secos, sin sal agregada. Frijoles enlatados sin sal. Huevos. Mantequillas de frutos secos sin sal. Atn o pollo enlatado con bajo contenido de sodio. Lcteos Leche. Leche de soja. Yogur. Quesos con bajo contenido de [place], como el queso suizo, [place], mozzarella y Freight forwarder. Sorbetes o helados ( taza por porcin). Queso crema. Grasas y aceites Mantequilla o margarina sin sal. Otros alimentos Pudines caseros. Bicarbonato y polvo de hornear que no contengan sodio. Hierbas y especias. Mezclas para condimento con bajo contenido de sodio. Bebidas T y caf. Bebidas carbonatadas. Es posible que los productos enumerados anteriormente no sean una lista completa de los alimentos con bajo contenido de [place]. Consulte a un nutricionista para obtener ms informacin. Cules son algunas alternativas para la sal a la hora de cocinar? Las hierbas, los condimentos y las especias pueden usarse en lugar de la sal para dar sabor a las comidas. Las hierbas deben estar frescas o secas. No elija las mezclas envasadas. Al lado del [place] de cada hierba, especia o condimento, se incluyen algunos alimentos con los que pueden combinarse. Hierbas Hojas de laurel: sopas, platos de carne y verduras, y salsa de espaguetis. [place]: platos de la cocina italiana, sopas, pastas y platos de pescado. Cilantro: carnes, aves y platos de verduras. [place]  en polvo: marinadas y platos de la cocina mexicana. [place]: alios para ensaladas y platos de patatas. Comino: platos de la cocina mexicana, cuscs y platos de carne. Eneldo: platos de pescado, salsas y ensaladas. Hinojo: platos de carne y verduras, panes y Gaffer. Ajo (no use sal de ajo): platos de la cocina italiana, platos de carne, alios para ensaladas y salsas. Mejorana: sopas, platos de patatas y platos de carne. Organo: pizzas y salsa de espaguetis. [place]: ensaladas, sopas, pastas y platos de carne. Romero: platos de la cocina italiana, alios para ensaladas, sopas y carnes rojas. Azafrn: platos de pescado, pastas y algunos platos de ave. Salvia: rellenos y salsas. Estragn: platos de pescado y de ave. Tomillo: rellenos, carnes y platos de pescado. Alios Jugo de limn: platos de pescado, platos de ave, verduras y ensaladas. Vinagre: alios para ensaladas, verduras y platos de pescado. Especias [place]: platos dulces, como tortas, galletas y pudines. Clavo de olor: pan de [place], pudines y [place] para carnes. [place]: platos de verduras, platos de pescado y de ave, y salteados. Jengibre: platos de verduras, platos de pescado y salteados. Nuez moscada: pastas, verduras, aves, platos de pescado y [place]. Esta informacin no tiene Theme park manager el consejo del mdico. Asegrese de hacerle al mdico cualquier pregunta que tenga. Document Revised: 12/10/2022 Document Reviewed: 12/10/2022 Elsevier Patient Education  2024 Elsevier Inc. Recuento de carbohidratos para la diabetes mellitus en los adultos Carbohydrate Counting for Diabetes Mellitus, Adult El recuento de carbohidratos es un mtodo para llevar un registro de la cantidad de carbohidratos  que se ingieren. La ingesta de carbohidratos aumenta la cantidad de azcar (glucosa) en la sangre. El recuento de la cantidad de carbohidratos que ingiere mejora el control de su glucemia. Esto, a su vez, le ayuda a controlar  su diabetes. Los carbohidratos se miden en gramos (g) por porcin. Es importante saber la cantidad de carbohidratos (en gramos o por tamao de porcin) que se puede ingerir en cada comida. Esto es Government social research officer. Un nutricionista puede ayudarlo a crear un plan de alimentacin y a calcular la cantidad de carbohidratos que debe ingerir en cada comida y colacin. Qu alimentos contienen carbohidratos? Los siguientes alimentos incluyen carbohidratos: Granos, como panes y cereales. Frijoles secos y productos con soja. Verduras con almidn, como papas, guisantes y maz. Nils Pyle y jugos de frutas. Leche y Dentist. Dulces y colaciones, como pasteles, galletas, caramelos, papas fritas de bolsa y refrescos. Cmo se calculan los carbohidratos de los alimentos? Hay dos maneras de calcular los carbohidratos de los alimentos. Puede leer las etiquetas de los alimentos o aprender cules son los tamaos de las porciones estndar de los alimentos. Puede usar cualquiera de estos mtodos o una combinacin de [place]. Usar la Air cabin crew de informacin nutricional La lista de Informacin nutricional est incluida en las etiquetas de casi todas las bebidas y los alimentos envasados de los [place]. Esto incluye lo siguiente: El tamao de la porcin. Informacin sobre los nutrientes de cada porcin, incluidos los gramos de carbohidratos por porcin. Para usar la Informacin nutricional, decida cuntas porciones tomar. Luego, multiplique el nmero de porciones por la cantidad de carbohidratos por porcin. El nmero resultante es la cantidad total de carbohidratos que comer. Conocer los tamaos de las porciones estndar de los alimentos Cuando coma alimentos que contengan carbohidratos y que no estn envasados o no incluyan la informacin nutricional en la etiqueta, debe medir las porciones para poder calcular los gramos de carbohidratos. Mida los alimentos que comer con una balanza de alimentos o una taza  medidora, si es necesario. Decida cuntas porciones de Programmer, systems. Multiplique el nmero de porciones por 15. En los alimentos que contienen carbohidratos, una porcin [place] a 15 g de carbohidratos. Por ejemplo, si come 2 tazas o 10 onzas (300 g) de fresas, habr comido 2 porciones y 30 g de carbohidratos (2 porciones x 15 g = 30 g). En el caso de las comidas que contienen mezclas de ms de un alimento, como las sopas y los guisos, debe calcular los carbohidratos de cada alimento que se incluye. La siguiente lista contiene los tamaos de porciones estndar de los alimentos ricos en carbohidratos ms comunes. Cada una de estas porciones tiene aproximadamente 15 g de carbohidratos: 1 rebanada de pan. 1 tortilla de seis pulgadas (15 cm). ? de taza o 2 onzas (53 g) de arroz o pasta cocidos.  taza o 3 onzas (85 g) de lentejas o frijoles cocidos o enlatados, escurridos y enjuagados.  taza o 3 onzas (85 g) de verduras con almidn, como guisantes, maz o zapallo.  taza o 4 onzas (120 g) de cereal caliente.  taza o 3 onzas (85 g) de papas hervidas o en pur, o  o 3 onzas (85 g) de una papa grande al horno.  taza o 4 onzas fluidas (118 ml) de jugo de frutas. 1 taza u 8 onzas fluidas (237 ml) de leche. 1 unidad pequea o 4 onzas (106 g) de manzana.  unidad o 2 onzas (63 g) de una banana mediana. 1 taza o  5 oz (150 g) de fresas. 3 tazas o 1 oz (28.3 g) de palomitas de maz. Cul sera un ejemplo de recuento de carbohidratos? Para calcular los gramos de carbohidratos de este ejemplo de comida, siga los pasos que se describen a continuacin. Ejemplo de comida 3 onzas (85 g) de pechugas de pollo. ? de taza o 4 onzas (106 g) de arroz integral.  taza o 3 onzas (85 g) de maz. 1 taza u 8 onzas fluidas (237 ml) de leche. 1 taza o 5 onzas (150 g) de fresas con crema batida sin azcar. Clculo de carbohidratos Identifique los alimentos que contienen  carbohidratos: Arroz. Maz. Leche. Jinny Sanders. Calcule cuntas porciones come de cada alimento: 2 porciones de arroz. 1 porcin de maz. 1 porcin de leche. 1 porcin de fresas. Multiplique cada nmero de porciones por 15 g: 2 porciones de arroz x 15 g = 30 g. 1 porcin de maz x 15 g = 15 g. 1 porcin de leche x 15 g = 15 g. 1 porcin de fresas x 15 g = 15 g. Sume todas las cantidades para conocer el total de gramos de carbohidratos consumidos: 30 g + 15 g + 15 g + 15 g = 75 g de carbohidratos en total. Consejos para seguir este plan Al ir de compras [place] un plan de comidas y luego haga una lista de compras. Compre verduras frescas y congeladas, frutas frescas y congeladas, productos lcteos, huevos, frijoles, lentejas y cereales integrales. Fjese en las etiquetas de los alimentos. Elija los alimentos que tengan ms fibra y Neurosurgeon. Evite los alimentos procesados y los alimentos con Biochemist, clinical. Planificacin de las comidas Trate de consumir la misma cantidad de gramos de carbohidratos en cada comida y en cada colacin. Planifique tomar comidas y colaciones regulares y equilibradas. Dnde buscar ms informacin American Diabetes Association (Asociacin Estadounidense de la Diabetes): diabetes.org Centers for Disease Control and Prevention (Centros para el Control y la Prevencin de [place]): TonerPromos.no Academy of Nutrition and Dietetics (Academia de Nutricin y Pension scheme manager): Theme [email or web address].org Association of Diabetes Care & Education Specialists (Asociacin de Especialistas en Atencin y Educacin sobre la Diabetes): diabeteseducator.org Resumen El recuento de carbohidratos es un mtodo para llevar un registro de la cantidad de carbohidratos que se ingieren. La ingesta de carbohidratos aumenta la cantidad de azcar (glucosa) en la sangre. El recuento de la cantidad de carbohidratos que ingiere mejora el control de su glucemia. Esto le ayuda a Chief Operating Officer su diabetes. Un  nutricionista puede ayudarlo a crear un plan de alimentacin y a calcular la cantidad de carbohidratos que debe ingerir en cada comida y colacin. Esta informacin no tiene Theme park manager el consejo del mdico. Asegrese de hacerle al mdico cualquier pregunta que tenga. Document Revised: 07/21/2020 Document Reviewed: 07/21/2020 Elsevier Patient Education  2024 [employer].

## 2024-02-09 ENCOUNTER — Other Ambulatory Visit: Payer: Self-pay

## 2024-02-09 ENCOUNTER — Other Ambulatory Visit: Payer: Self-pay | Admitting: Gerontology

## 2024-02-09 DIAGNOSIS — G4700 Insomnia, unspecified: Secondary | ICD-10-CM

## 2024-02-12 ENCOUNTER — Encounter: Payer: Self-pay | Admitting: Internal Medicine

## 2024-02-12 ENCOUNTER — Other Ambulatory Visit: Payer: Self-pay

## 2024-02-13 ENCOUNTER — Other Ambulatory Visit: Payer: Self-pay

## 2024-02-14 ENCOUNTER — Other Ambulatory Visit: Payer: Self-pay

## 2024-02-14 ENCOUNTER — Other Ambulatory Visit: Payer: Self-pay | Admitting: Gerontology

## 2024-02-14 DIAGNOSIS — G4700 Insomnia, unspecified: Secondary | ICD-10-CM

## 2024-02-15 ENCOUNTER — Other Ambulatory Visit: Payer: Self-pay

## 2024-02-15 ENCOUNTER — Other Ambulatory Visit: Payer: Self-pay | Admitting: Gerontology

## 2024-02-15 DIAGNOSIS — G4700 Insomnia, unspecified: Secondary | ICD-10-CM

## 2024-02-22 ENCOUNTER — Ambulatory Visit: Payer: Self-pay | Admitting: Physician Assistant

## 2024-03-28 ENCOUNTER — Telehealth: Payer: Self-pay

## 2024-03-28 NOTE — Telephone Encounter (Signed)
 A user error has taken place: encounter opened in error, closed for administrative reasons.

## 2024-04-02 ENCOUNTER — Encounter (INDEPENDENT_AMBULATORY_CARE_PROVIDER_SITE_OTHER): Payer: Self-pay

## 2024-05-07 ENCOUNTER — Ambulatory Visit: Payer: Self-pay | Admitting: Gerontology
